# Patient Record
Sex: Female | Born: 1995 | Race: Black or African American | Hispanic: No | Marital: Single | Smoking: Never smoker
Health system: Southern US, Community
[De-identification: ages and names within clinical notes are randomized; demographics above are authoritative.]

## PROBLEM LIST (undated history)

## (undated) ENCOUNTER — Inpatient Hospital Stay (HOSPITAL_COMMUNITY): Payer: Self-pay

## (undated) HISTORY — PX: NO PAST SURGERIES: SHX2092

---

## 2016-10-19 ENCOUNTER — Encounter (HOSPITAL_COMMUNITY): Payer: Self-pay

## 2016-10-19 ENCOUNTER — Emergency Department (HOSPITAL_COMMUNITY): Payer: Medicaid Other

## 2016-10-19 ENCOUNTER — Inpatient Hospital Stay (HOSPITAL_COMMUNITY)
Admission: EM | Admit: 2016-10-19 | Discharge: 2016-10-22 | DRG: 481 | Disposition: A | Discharge status: Home / Self Care | Payer: Medicaid Other | Attending: Orthopaedic Surgery | Admitting: Orthopaedic Surgery

## 2016-10-19 DIAGNOSIS — S72322B Displaced transverse fracture of shaft of left femur, initial encounter for open fracture type I or II: Secondary | ICD-10-CM | POA: Diagnosis present

## 2016-10-19 DIAGNOSIS — O99011 Anemia complicating pregnancy, first trimester: Secondary | ICD-10-CM | POA: Diagnosis not present

## 2016-10-19 DIAGNOSIS — O9989 Other specified diseases and conditions complicating pregnancy, childbirth and the puerperium: Secondary | ICD-10-CM | POA: Diagnosis present

## 2016-10-19 DIAGNOSIS — R262 Difficulty in walking, not elsewhere classified: Secondary | ICD-10-CM

## 2016-10-19 DIAGNOSIS — Z8781 Personal history of (healed) traumatic fracture: Secondary | ICD-10-CM

## 2016-10-19 DIAGNOSIS — S72322A Displaced transverse fracture of shaft of left femur, initial encounter for closed fracture: Secondary | ICD-10-CM | POA: Diagnosis present

## 2016-10-19 DIAGNOSIS — Y92410 Unspecified street and highway as the place of occurrence of the external cause: Secondary | ICD-10-CM | POA: Diagnosis not present

## 2016-10-19 DIAGNOSIS — S7292XA Unspecified fracture of left femur, initial encounter for closed fracture: Secondary | ICD-10-CM

## 2016-10-19 DIAGNOSIS — D62 Acute posthemorrhagic anemia: Secondary | ICD-10-CM | POA: Diagnosis not present

## 2016-10-19 DIAGNOSIS — Z3A10 10 weeks gestation of pregnancy: Secondary | ICD-10-CM

## 2016-10-19 DIAGNOSIS — Z9889 Other specified postprocedural states: Secondary | ICD-10-CM

## 2016-10-19 LAB — CBC WITH DIFFERENTIAL/PLATELET
Basophils Absolute: 0 10*3/uL (ref 0.0–0.1)
Basophils Relative: 0 %
EOS ABS: 0.2 10*3/uL (ref 0.0–0.7)
Eosinophils Relative: 1 %
HCT: 35.8 % — ABNORMAL LOW (ref 36.0–46.0)
HEMOGLOBIN: 12.1 g/dL (ref 12.0–15.0)
LYMPHS ABS: 1.7 10*3/uL (ref 0.7–4.0)
LYMPHS PCT: 8 %
MCH: 28.4 pg (ref 26.0–34.0)
MCHC: 33.8 g/dL (ref 30.0–36.0)
MCV: 84 fL (ref 78.0–100.0)
MONOS PCT: 8 %
Monocytes Absolute: 1.6 10*3/uL — ABNORMAL HIGH (ref 0.1–1.0)
NEUTROS PCT: 83 %
Neutro Abs: 17 10*3/uL — ABNORMAL HIGH (ref 1.7–7.7)
Platelets: 246 10*3/uL (ref 150–400)
RBC: 4.26 MIL/uL (ref 3.87–5.11)
RDW: 14.7 % (ref 11.5–15.5)
WBC: 20.5 10*3/uL — AB (ref 4.0–10.5)

## 2016-10-19 LAB — I-STAT CHEM 8, ED
BUN: 5 mg/dL — ABNORMAL LOW (ref 6–20)
CHLORIDE: 106 mmol/L (ref 101–111)
CREATININE: 0.5 mg/dL (ref 0.44–1.00)
Calcium, Ion: 1.07 mmol/L — ABNORMAL LOW (ref 1.15–1.40)
Glucose, Bld: 139 mg/dL — ABNORMAL HIGH (ref 65–99)
HEMATOCRIT: 37 % (ref 36.0–46.0)
HEMOGLOBIN: 12.6 g/dL (ref 12.0–15.0)
POTASSIUM: 3.2 mmol/L — AB (ref 3.5–5.1)
Sodium: 139 mmol/L (ref 135–145)
TCO2: 21 mmol/L (ref 0–100)

## 2016-10-19 LAB — I-STAT BETA HCG BLOOD, ED (MC, WL, AP ONLY)

## 2016-10-19 LAB — COMPREHENSIVE METABOLIC PANEL
ALBUMIN: 3.4 g/dL — AB (ref 3.5–5.0)
ALK PHOS: 57 U/L (ref 38–126)
ALT: 17 U/L (ref 14–54)
AST: 30 U/L (ref 15–41)
Anion gap: 10 (ref 5–15)
BILIRUBIN TOTAL: 0.5 mg/dL (ref 0.3–1.2)
BUN: 5 mg/dL — AB (ref 6–20)
CALCIUM: 8.7 mg/dL — AB (ref 8.9–10.3)
CO2: 22 mmol/L (ref 22–32)
CREATININE: 0.71 mg/dL (ref 0.44–1.00)
Chloride: 107 mmol/L (ref 101–111)
GFR calc Af Amer: >60 mL/min (ref >60)
GFR calc non Af Amer: >60 mL/min (ref >60)
Glucose, Bld: 143 mg/dL — ABNORMAL HIGH (ref 65–99)
Potassium: 3.1 mmol/L — ABNORMAL LOW (ref 3.5–5.1)
Sodium: 139 mmol/L (ref 135–145)
TOTAL PROTEIN: 6.5 g/dL (ref 6.5–8.1)

## 2016-10-19 LAB — HCG, SERUM, QUALITATIVE: Preg, Serum: POSITIVE — AB

## 2016-10-19 LAB — TYPE AND SCREEN
ABO/RH(D): A POS
Antibody Screen: NEGATIVE

## 2016-10-19 LAB — I-STAT CG4 LACTIC ACID, ED: Lactic Acid, Venous: 3.18 mmol/L (ref 0.5–1.9)

## 2016-10-19 LAB — ABO/RH: ABO/RH(D): A POS

## 2016-10-19 MED ORDER — OXYCODONE HCL 5 MG PO TABS
5.0000 mg | ORAL_TABLET | ORAL | Status: DC | PRN
  Administered 2016-10-20 (×2): 10 mg via ORAL
  Filled 2016-10-19 (×2): qty 2

## 2016-10-19 MED ORDER — ACETAMINOPHEN 325 MG PO TABS: 650.0000 mg | ORAL_TABLET | Freq: Four times a day (QID) | ORAL | Status: DC | PRN

## 2016-10-19 MED ORDER — PRENATAL 27-0.8 MG PO TABS
1.0000 | ORAL_TABLET | Freq: Every day | ORAL | Status: DC
  Filled 2016-10-19: qty 1

## 2016-10-19 MED ORDER — CEFAZOLIN SODIUM-DEXTROSE 2-4 GM/100ML-% IV SOLN
2.0000 g | INTRAVENOUS | Status: AC
  Administered 2016-10-20: 2 g via INTRAVENOUS
  Filled 2016-10-19: qty 100

## 2016-10-19 MED ORDER — FENTANYL CITRATE (PF) 100 MCG/2ML IJ SOLN
INTRAMUSCULAR | Status: AC
  Filled 2016-10-19: qty 2

## 2016-10-19 MED ORDER — SODIUM CHLORIDE 0.9 % IV SOLN
INTRAVENOUS | Status: AC | PRN
  Administered 2016-10-19 (×2): 1000 mL via INTRAVENOUS

## 2016-10-19 MED ORDER — MORPHINE SULFATE (PF) 4 MG/ML IV SOLN
1.0000 mg | INTRAVENOUS | Status: DC | PRN
  Administered 2016-10-19 – 2016-10-20 (×3): 1 mg via INTRAVENOUS
  Filled 2016-10-19 (×3): qty 1

## 2016-10-19 MED ORDER — FENTANYL CITRATE (PF) 100 MCG/2ML IJ SOLN
INTRAMUSCULAR | Status: AC | PRN
  Administered 2016-10-19 (×2): 50 ug via INTRAVENOUS

## 2016-10-19 MED ORDER — POLYETHYLENE GLYCOL 3350 17 G PO PACK: 17.0000 g | PACK | Freq: Every day | ORAL | Status: DC | PRN

## 2016-10-19 MED ORDER — FENTANYL CITRATE (PF) 100 MCG/2ML IJ SOLN
50.0000 ug | Freq: Once | INTRAMUSCULAR | Status: AC
  Administered 2016-10-20 (×2): 50 ug via INTRAVENOUS
  Administered 2016-10-20 (×2): 100 ug via INTRAVENOUS

## 2016-10-19 MED ORDER — ACETAMINOPHEN 650 MG RE SUPP: 650.0000 mg | Freq: Four times a day (QID) | RECTAL | Status: DC | PRN

## 2016-10-19 MED ORDER — SENNA 8.6 MG PO TABS: 1.0000 | ORAL_TABLET | Freq: Two times a day (BID) | ORAL | Status: DC

## 2016-10-19 MED ORDER — IOPAMIDOL (ISOVUE-300) INJECTION 61%
100.0000 mL | Freq: Once | INTRAVENOUS | Status: AC | PRN
  Administered 2016-10-19: 100 mL via INTRAVENOUS

## 2016-10-19 MED ORDER — METHOCARBAMOL 500 MG PO TABS
500.0000 mg | ORAL_TABLET | Freq: Four times a day (QID) | ORAL | Status: DC | PRN
  Administered 2016-10-20 – 2016-10-22 (×4): 500 mg via ORAL
  Filled 2016-10-19 (×5): qty 1

## 2016-10-19 MED ORDER — DIAZEPAM 5 MG PO TABS
5.0000 mg | ORAL_TABLET | Freq: Four times a day (QID) | ORAL | Status: DC | PRN
  Administered 2016-10-19: 5 mg via ORAL
  Filled 2016-10-19: qty 1
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
## 2016-10-19 NOTE — Progress Notes (Signed)
Orthopedic Tech Progress Note Patient Details:  [name] 06-28-1996 [id number]  Musculoskeletal Traction Type of Traction: Gilberto BetterHare Traction Traction Location: lle Applied hare traction with assistance of dr and dan.   Trinna PostMartinez, Dorthy Magnussen J 10/19/2016, 8:31 PM
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
## 2016-10-19 NOTE — Progress Notes (Signed)
Orthopedic Tech Progress Note Patient Details:  Rhonda Blevins [birth year] [id number]  Musculoskeletal Traction Type of Traction: Gilberto BetterHare Traction Traction Location: Adjusted Hare Traction with more tenstion.  Assisted Nurse with Transfer to 5North bed [phone]. Pt did well with Transfer and additional Traction tension.   Left Leg  Alvina ChouWilliams, Talisha Erby C 10/19/2016, 10:55 PM

## 2016-10-20 ENCOUNTER — Encounter (HOSPITAL_COMMUNITY)
Admission: EM | Disposition: A | Discharge status: Home / Self Care | Payer: Self-pay | Source: Home / Self Care | Attending: Orthopaedic Surgery

## 2016-10-20 ENCOUNTER — Inpatient Hospital Stay (HOSPITAL_COMMUNITY): Payer: Medicaid Other

## 2016-10-20 ENCOUNTER — Inpatient Hospital Stay (HOSPITAL_COMMUNITY): Payer: Medicaid Other | Admitting: Anesthesiology

## 2016-10-20 ENCOUNTER — Encounter (HOSPITAL_COMMUNITY): Payer: Self-pay | Admitting: Registered Nurse

## 2016-10-20 DIAGNOSIS — S72322B Displaced transverse fracture of shaft of left femur, initial encounter for open fracture type I or II: Secondary | ICD-10-CM

## 2016-10-20 HISTORY — PX: FEMUR IM NAIL: SHX1597

## 2016-10-20 LAB — CBC
HEMATOCRIT: 34.3 % — AB (ref 36.0–46.0)
HEMOGLOBIN: 11.4 g/dL — AB (ref 12.0–15.0)
MCH: 28.1 pg (ref 26.0–34.0)
MCHC: 33.2 g/dL (ref 30.0–36.0)
MCV: 84.5 fL (ref 78.0–100.0)
Platelets: 227 10*3/uL (ref 150–400)
RBC: 4.06 MIL/uL (ref 3.87–5.11)
RDW: 14.8 % (ref 11.5–15.5)
WBC: 17.4 10*3/uL — AB (ref 4.0–10.5)

## 2016-10-20 LAB — CREATININE, SERUM: Creatinine, Ser: 0.69 mg/dL (ref 0.44–1.00)

## 2016-10-20 LAB — SURGICAL PCR SCREEN
MRSA, PCR: NEGATIVE
Staphylococcus aureus: NEGATIVE

## 2016-10-20 SURGERY — INSERTION, INTRAMEDULLARY ROD, FEMUR
Anesthesia: General | Laterality: Left

## 2016-10-20 MED ORDER — OXYCODONE HCL 5 MG PO TABS: 5.0000 mg | ORAL_TABLET | Freq: Once | ORAL | Status: DC | PRN

## 2016-10-20 MED ORDER — ONDANSETRON HCL 4 MG/2ML IJ SOLN
4.0000 mg | Freq: Four times a day (QID) | INTRAMUSCULAR | Status: DC | PRN
  Administered 2016-10-22: 4 mg via INTRAVENOUS
  Filled 2016-10-20: qty 2

## 2016-10-20 MED ORDER — ENOXAPARIN SODIUM 40 MG/0.4ML ~~LOC~~ SOLN: 40.0000 mg | Freq: Every day | SUBCUTANEOUS | 0 refills | Status: DC

## 2016-10-20 MED ORDER — PHENOL 1.4 % MT LIQD: 1.0000 | OROMUCOSAL | Status: DC | PRN

## 2016-10-20 MED ORDER — 0.9 % SODIUM CHLORIDE (POUR BTL) OPTIME
TOPICAL | Status: DC | PRN
  Administered 2016-10-20: 1000 mL

## 2016-10-20 MED ORDER — ONDANSETRON HCL 4 MG/2ML IJ SOLN
INTRAMUSCULAR | Status: AC
  Filled 2016-10-20: qty 2

## 2016-10-20 MED ORDER — LIDOCAINE 2% (20 MG/ML) 5 ML SYRINGE
INTRAMUSCULAR | Status: DC | PRN
  Administered 2016-10-20: 40 mg via INTRAVENOUS

## 2016-10-20 MED ORDER — PROPOFOL 10 MG/ML IV BOLUS
INTRAVENOUS | Status: DC | PRN
  Administered 2016-10-20: 140 mg via INTRAVENOUS

## 2016-10-20 MED ORDER — OXYCODONE HCL 5 MG PO TABS: 5.0000 mg | ORAL_TABLET | ORAL | 0 refills | Status: DC | PRN

## 2016-10-20 MED ORDER — LIDOCAINE 2% (20 MG/ML) 5 ML SYRINGE
INTRAMUSCULAR | Status: AC
  Filled 2016-10-20: qty 5

## 2016-10-20 MED ORDER — METOCLOPRAMIDE HCL 5 MG PO TABS: 5.0000 mg | ORAL_TABLET | Freq: Three times a day (TID) | ORAL | Status: DC | PRN

## 2016-10-20 MED ORDER — ACETAMINOPHEN 650 MG RE SUPP: 650.0000 mg | Freq: Four times a day (QID) | RECTAL | Status: DC | PRN

## 2016-10-20 MED ORDER — SUGAMMADEX SODIUM 200 MG/2ML IV SOLN
INTRAVENOUS | Status: AC
  Filled 2016-10-20: qty 2

## 2016-10-20 MED ORDER — LACTATED RINGERS IV SOLN
INTRAVENOUS | Status: DC
  Administered 2016-10-20 (×2): via INTRAVENOUS

## 2016-10-20 MED ORDER — ALUM & MAG HYDROXIDE-SIMETH 200-200-20 MG/5ML PO SUSP: 30.0000 mL | ORAL | Status: DC | PRN

## 2016-10-20 MED ORDER — FENTANYL CITRATE (PF) 100 MCG/2ML IJ SOLN
25.0000 ug | INTRAMUSCULAR | Status: DC | PRN
  Administered 2016-10-20 (×2): 50 ug via INTRAVENOUS

## 2016-10-20 MED ORDER — DEXAMETHASONE SODIUM PHOSPHATE 10 MG/ML IJ SOLN
INTRAMUSCULAR | Status: DC | PRN
  Administered 2016-10-20: 10 mg via INTRAVENOUS

## 2016-10-20 MED ORDER — METHOCARBAMOL 500 MG PO TABS
500.0000 mg | ORAL_TABLET | Freq: Four times a day (QID) | ORAL | Status: DC | PRN
  Administered 2016-10-22: 500 mg via ORAL
  Filled 2016-10-20: qty 1

## 2016-10-20 MED ORDER — CEFAZOLIN SODIUM-DEXTROSE 2-4 GM/100ML-% IV SOLN
2.0000 g | Freq: Four times a day (QID) | INTRAVENOUS | Status: AC
  Administered 2016-10-20 – 2016-10-21 (×3): 2 g via INTRAVENOUS
  Filled 2016-10-20 (×3): qty 100

## 2016-10-20 MED ORDER — ONDANSETRON HCL 4 MG/2ML IJ SOLN: 4.0000 mg | Freq: Once | INTRAMUSCULAR | Status: DC | PRN

## 2016-10-20 MED ORDER — DEXAMETHASONE SODIUM PHOSPHATE 10 MG/ML IJ SOLN
INTRAMUSCULAR | Status: AC
  Filled 2016-10-20: qty 1

## 2016-10-20 MED ORDER — MORPHINE SULFATE (PF) 2 MG/ML IV SOLN: 0.5000 mg | INTRAVENOUS | Status: DC | PRN

## 2016-10-20 MED ORDER — ENOXAPARIN SODIUM 40 MG/0.4ML ~~LOC~~ SOLN
40.0000 mg | SUBCUTANEOUS | Status: DC
  Administered 2016-10-21 – 2016-10-22 (×2): 40 mg via SUBCUTANEOUS
  Filled 2016-10-20 (×2): qty 0.4

## 2016-10-20 MED ORDER — FENTANYL CITRATE (PF) 100 MCG/2ML IJ SOLN
INTRAMUSCULAR | Status: AC
  Filled 2016-10-20: qty 4

## 2016-10-20 MED ORDER — FENTANYL CITRATE (PF) 100 MCG/2ML IJ SOLN
INTRAMUSCULAR | Status: AC
  Administered 2016-10-20: 50 ug via INTRAVENOUS
  Filled 2016-10-20: qty 2

## 2016-10-20 MED ORDER — ONDANSETRON HCL 4 MG PO TABS: 4.0000 mg | ORAL_TABLET | Freq: Four times a day (QID) | ORAL | Status: DC | PRN

## 2016-10-20 MED ORDER — METHOCARBAMOL 750 MG PO TABS: 750.0000 mg | ORAL_TABLET | Freq: Two times a day (BID) | ORAL | 0 refills | Status: DC | PRN

## 2016-10-20 MED ORDER — ACETAMINOPHEN 325 MG PO TABS: 650.0000 mg | ORAL_TABLET | Freq: Four times a day (QID) | ORAL | Status: DC | PRN

## 2016-10-20 MED ORDER — SUCCINYLCHOLINE CHLORIDE 200 MG/10ML IV SOSY
PREFILLED_SYRINGE | INTRAVENOUS | Status: DC | PRN
  Administered 2016-10-20: 120 mg via INTRAVENOUS

## 2016-10-20 MED ORDER — SUCCINYLCHOLINE CHLORIDE 200 MG/10ML IV SOSY
PREFILLED_SYRINGE | INTRAVENOUS | Status: AC
  Filled 2016-10-20: qty 10

## 2016-10-20 MED ORDER — OXYCODONE HCL 5 MG/5ML PO SOLN: 5.0000 mg | Freq: Once | ORAL | Status: DC | PRN

## 2016-10-20 MED ORDER — MIDAZOLAM HCL 2 MG/2ML IJ SOLN
INTRAMUSCULAR | Status: AC
  Filled 2016-10-20: qty 2

## 2016-10-20 MED ORDER — OXYCODONE HCL 5 MG PO TABS
5.0000 mg | ORAL_TABLET | ORAL | Status: DC | PRN
  Administered 2016-10-20 – 2016-10-21 (×2): 10 mg via ORAL
  Filled 2016-10-20 (×2): qty 2

## 2016-10-20 MED ORDER — PROPOFOL 10 MG/ML IV BOLUS
INTRAVENOUS | Status: AC
  Filled 2016-10-20: qty 20

## 2016-10-20 MED ORDER — ONDANSETRON HCL 4 MG PO TABS: 4.0000 mg | ORAL_TABLET | Freq: Three times a day (TID) | ORAL | 0 refills | Status: DC | PRN

## 2016-10-20 MED ORDER — ROCURONIUM BROMIDE 10 MG/ML (PF) SYRINGE
PREFILLED_SYRINGE | INTRAVENOUS | Status: AC
  Filled 2016-10-20: qty 10

## 2016-10-20 MED ORDER — DEXTROSE 5 % IV SOLN
500.0000 mg | Freq: Four times a day (QID) | INTRAVENOUS | Status: DC | PRN
  Filled 2016-10-20: qty 5

## 2016-10-20 MED ORDER — HYDROCODONE-ACETAMINOPHEN 5-325 MG PO TABS
1.0000 | ORAL_TABLET | Freq: Four times a day (QID) | ORAL | Status: DC | PRN
  Administered 2016-10-21 – 2016-10-22 (×6): 2 via ORAL
  Filled 2016-10-20 (×6): qty 2

## 2016-10-20 MED ORDER — MENTHOL 3 MG MT LOZG
1.0000 | LOZENGE | OROMUCOSAL | Status: DC | PRN
Start: 2016-10-20 — End: 2016-10-22

## 2016-10-20 MED ORDER — SENNOSIDES-DOCUSATE SODIUM 8.6-50 MG PO TABS: 1.0000 | ORAL_TABLET | Freq: Every evening | ORAL | 1 refills | Status: DC | PRN

## 2016-10-20 MED ORDER — SUGAMMADEX SODIUM 200 MG/2ML IV SOLN
INTRAVENOUS | Status: DC | PRN
  Administered 2016-10-20: 200 mg via INTRAVENOUS

## 2016-10-20 MED ORDER — ONDANSETRON HCL 4 MG/2ML IJ SOLN
INTRAMUSCULAR | Status: DC | PRN
  Administered 2016-10-20: 4 mg via INTRAVENOUS

## 2016-10-20 MED ORDER — ROCURONIUM BROMIDE 10 MG/ML (PF) SYRINGE
PREFILLED_SYRINGE | INTRAVENOUS | Status: DC | PRN
  Administered 2016-10-20: 50 mg via INTRAVENOUS

## 2016-10-20 MED ORDER — METOCLOPRAMIDE HCL 5 MG/ML IJ SOLN: 5.0000 mg | Freq: Three times a day (TID) | INTRAMUSCULAR | Status: DC | PRN

## 2016-10-20 MED ORDER — SODIUM CHLORIDE 0.9 % IV SOLN
INTRAVENOUS | Status: DC
  Administered 2016-10-20: 16:00:00 via INTRAVENOUS

## 2016-10-20 MED ORDER — FENTANYL CITRATE (PF) 100 MCG/2ML IJ SOLN
INTRAMUSCULAR | Status: AC
  Filled 2016-10-20: qty 2

## 2016-10-20 SURGICAL SUPPLY — 51 items
BIT DRILL LONG 4.0 (BIT) ×1 IMPLANT
BIT DRILL SHORT 4.0 (BIT) ×1 IMPLANT
BNDG COHESIVE 4X5 TAN STRL (GAUZE/BANDAGES/DRESSINGS) ×3 IMPLANT
COVER PERINEAL POST (MISCELLANEOUS) ×3 IMPLANT
COVER SURGICAL LIGHT HANDLE (MISCELLANEOUS) ×3 IMPLANT
DRAPE C-ARMOR (DRAPES) ×3 IMPLANT
DRAPE INCISE IOBAN 66X45 STRL (DRAPES) ×3 IMPLANT
DRAPE ORTHO SPLIT 77X108 STRL (DRAPES)
DRAPE PROXIMA HALF (DRAPES) ×9 IMPLANT
DRAPE SURG ORHT 6 SPLT 77X108 (DRAPES) IMPLANT
DRILL BIT LONG 4.0 (BIT) ×3
DRILL BIT SHORT 4.0 (BIT) ×2
DRSG MEPILEX BORDER 4X4 (GAUZE/BANDAGES/DRESSINGS) ×9 IMPLANT
DURAPREP 26ML APPLICATOR (WOUND CARE) ×3 IMPLANT
ELECT CAUTERY BLADE 6.4 (BLADE) ×3 IMPLANT
ELECT REM PT RETURN 9FT ADLT (ELECTROSURGICAL) ×3
ELECTRODE REM PT RTRN 9FT ADLT (ELECTROSURGICAL) ×1 IMPLANT
FACESHIELD WRAPAROUND (MASK) IMPLANT
GLOVE BIO SURGEON STRL SZ 6.5 (GLOVE) ×2 IMPLANT
GLOVE BIO SURGEONS STRL SZ 6.5 (GLOVE) ×1
GLOVE BIOGEL PI IND STRL 6.5 (GLOVE) ×2 IMPLANT
GLOVE BIOGEL PI IND STRL 7.0 (GLOVE) ×1 IMPLANT
GLOVE BIOGEL PI INDICATOR 6.5 (GLOVE) ×4
GLOVE BIOGEL PI INDICATOR 7.0 (GLOVE) ×2
GLOVE SKINSENSE NS SZ7.5 (GLOVE) ×4
GLOVE SKINSENSE STRL SZ7.5 (GLOVE) ×2 IMPLANT
GLOVE SURG SYN 7.5  E (GLOVE) ×2
GLOVE SURG SYN 7.5 E (GLOVE) ×1 IMPLANT
GOWN BRE IMP SLV AUR LG STRL (GOWN DISPOSABLE) ×6 IMPLANT
GOWN STRL REIN XL XLG (GOWN DISPOSABLE) ×3 IMPLANT
GUIDE PIN 3.2X343 (PIN) ×1
GUIDE PIN 3.2X343MM (PIN) ×2
GUIDE ROD 3.0 (MISCELLANEOUS) ×3
KIT BASIN OR (CUSTOM PROCEDURE TRAY) ×3 IMPLANT
KIT ROOM TURNOVER OR (KITS) ×3 IMPLANT
LINER BOOT UNIVERSAL DISP (MISCELLANEOUS) ×3 IMPLANT
MANIFOLD NEPTUNE II (INSTRUMENTS) IMPLANT
NAIL TRIGEN META LEFT 9.0X38MM (Nail) ×3 IMPLANT
NS IRRIG 1000ML POUR BTL (IV SOLUTION) ×3 IMPLANT
PACK GENERAL/GYN (CUSTOM PROCEDURE TRAY) ×3 IMPLANT
PAD ARMBOARD 7.5X6 YLW CONV (MISCELLANEOUS) ×9 IMPLANT
PIN GUIDE 3.2X343MM (PIN) ×1 IMPLANT
ROD GUIDE 3.0 (MISCELLANEOUS) ×1 IMPLANT
SCREW TRIGEN LOW PROF 5.0X37.5 (Screw) ×3 IMPLANT
SCREW TRIGEN LOW PROF 5.0X55 (Screw) ×3 IMPLANT
STAPLER VISISTAT 35W (STAPLE) ×3 IMPLANT
SUT VIC AB 0 CT1 27 (SUTURE) ×2
SUT VIC AB 0 CT1 27XBRD ANBCTR (SUTURE) ×1 IMPLANT
SUT VIC AB 2-0 CT1 27 (SUTURE) ×2
SUT VIC AB 2-0 CT1 TAPERPNT 27 (SUTURE) ×1 IMPLANT
WATER STERILE IRR 1000ML POUR (IV SOLUTION) ×6 IMPLANT

## 2016-10-20 NOTE — Transfer of Care (Signed)
Immediate Anesthesia Transfer of Care Note  Patient: Rhonda Blevins  Procedure(s) Performed: Procedure(s): INTRAMEDULLARY (IM) NAIL FEMORAL (Left)  Patient Location: PACU  Anesthesia Type:General  Level of Consciousness:  sedated, patient cooperative and responds to stimulation  Airway & Oxygen Therapy:Patient Spontanous Breathing and Patient connected to face mask oxgen  Post-op Assessment:  Report given to PACU RN and Post -op Vital signs reviewed and stable  Post vital signs:  Reviewed and stable  Last Vitals:  Vitals:   10/19/16 2304 10/20/16 0430  BP: (!) 143/89 (!) 145/85  Pulse: (!) 108 (!) 108  Resp: 20 20  Temp: 37.2 C 36.8 C    Complications: No apparent anesthesia complications

## 2016-10-20 NOTE — Anesthesia Postprocedure Evaluation (Signed)
Anesthesia Post Note  Patient: Rhonda Blevins  Procedure(s) Performed: Procedure(s) (LRB): INTRAMEDULLARY (IM) NAIL FEMORAL (Left)  Patient location during evaluation: PACU Anesthesia Type: General Level of consciousness: awake, awake and alert and oriented Pain management: pain level controlled Vital Signs Assessment: post-procedure vital signs reviewed and stable Respiratory status: spontaneous breathing, nonlabored ventilation and respiratory function stable Cardiovascular status: blood pressure returned to baseline    Last Vitals:  Vitals:   10/20/16 1242 10/20/16 1315  BP: (!) 147/79 130/84  Pulse: (!) 122 (!) 108  Resp: (!) 24   Temp: 36.6 C 37.1 C    Last Pain:  Vitals:   10/20/16 1315  TempSrc: Oral  PainSc:                  [name]
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
## 2016-10-20 NOTE — Anesthesia Procedure Notes (Signed)
Procedure Name: Intubation Date/Time: 10/20/2016 10:07 AM Performed by: Jhonnie GarnerMARSHALL, Angline Schweigert M Pre-anesthesia Checklist: Patient identified, Emergency Drugs available, Suction available and Patient being monitored Patient Re-evaluated:Patient Re-evaluated prior to inductionOxygen Delivery Method: Circle system utilized Preoxygenation: Pre-oxygenation with 100% oxygen Intubation Type: IV induction, Rapid sequence and Cricoid Pressure applied Laryngoscope Size: Miller and 2 Grade View: Grade I Tube type: Oral Tube size: 7.0 mm Number of attempts: 1 Airway Equipment and Method: Stylet Placement Confirmation: ETT inserted through vocal cords under direct vision,  positive ETCO2 and breath sounds checked- equal and bilateral Secured at: 21 cm Tube secured with: Tape Dental Injury: Teeth and Oropharynx as per pre-operative assessment

## 2016-10-20 NOTE — Progress Notes (Signed)
Report called to Gaynelle Aduobbie, RN [facility].

## 2016-10-20 NOTE — Progress Notes (Signed)
Pt surgical dressing on Lt lateral leg soaked with blood. No active bleeding noted. Put 4x4 gauze and abdominal pads and tape it. Nursing will continue to monitor.
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
## 2016-10-20 NOTE — Discharge Instructions (Signed)
° ° °  1. Change dressings as needed °2. May shower but keep incisions covered and dry °3. Take lovenox to prevent blood clots °4. Take stool softeners as needed °5. Take pain meds as needed ° °

## 2016-10-21 LAB — CBC
HCT: 29.6 % — ABNORMAL LOW (ref 36.0–46.0)
Hemoglobin: 9.8 g/dL — ABNORMAL LOW (ref 12.0–15.0)
MCH: 27.9 pg (ref 26.0–34.0)
MCHC: 33.1 g/dL (ref 30.0–36.0)
MCV: 84.3 fL (ref 78.0–100.0)
PLATELETS: 212 10*3/uL (ref 150–400)
RBC: 3.51 MIL/uL — ABNORMAL LOW (ref 3.87–5.11)
RDW: 14.9 % (ref 11.5–15.5)
WBC: 16.8 10*3/uL — AB (ref 4.0–10.5)

## 2016-10-21 LAB — BASIC METABOLIC PANEL
ANION GAP: 6 (ref 5–15)
BUN: <5 mg/dL — ABNORMAL LOW (ref 6–20)
CALCIUM: 8.1 mg/dL — AB (ref 8.9–10.3)
CO2: 25 mmol/L (ref 22–32)
Chloride: 104 mmol/L (ref 101–111)
Creatinine, Ser: 0.64 mg/dL (ref 0.44–1.00)
GFR calc non Af Amer: >60 mL/min (ref >60)
Glucose, Bld: 95 mg/dL (ref 65–99)
Potassium: 3.5 mmol/L (ref 3.5–5.1)
SODIUM: 135 mmol/L (ref 135–145)
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
## 2016-10-21 NOTE — Progress Notes (Signed)
   Subjective:  Patient reports pain as mild.    Objective:   VITALS:   Vitals:   10/20/16 1315 10/20/16 2136 10/21/16 0111 10/21/16 0618  BP: 130/84 133/78 137/77 124/77  Pulse: (!) 108 85 85 94  Resp:  18 16 16   Temp: 98.7 F (37.1 C) 99.7 F (37.6 C) 99.5 F (37.5 C) 98.7 F (37.1 C)  TempSrc: Oral Oral Oral Oral  SpO2: 100% 100% 100% 100%  Weight:      Height:        Neurologically intact Neurovascular intact Sensation intact distally Intact pulses distally Dorsiflexion/Plantar flexion intact Incision: dressing C/D/I and no drainage No cellulitis present Compartment soft   Lab Results  Component Value Date   WBC 16.8 (H) 10/21/2016   HGB 9.8 (L) 10/21/2016   HCT 29.6 (L) 10/21/2016   MCV 84.3 10/21/2016   PLT 212 10/21/2016     Assessment/Plan:  1 Day Post-Op   - Expected postop acute blood loss anemia - will monitor for symptoms - Up with PT/OT - DVT ppx - SCDs, ambulation, lovenox - WBAT operative extremity - Pain control - Discharge planning - may go home today if she does well with PT, otherwise Dannielle Huhmonday  Michael Sharnell Knight 10/21/2016, 8:59 AM [phone]
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
## 2016-10-22 LAB — BASIC METABOLIC PANEL
ANION GAP: 9 (ref 5–15)
CO2: 24 mmol/L (ref 22–32)
Calcium: 8.5 mg/dL — ABNORMAL LOW (ref 8.9–10.3)
Chloride: 102 mmol/L (ref 101–111)
Creatinine, Ser: 0.58 mg/dL (ref 0.44–1.00)
Glucose, Bld: 84 mg/dL (ref 65–99)
POTASSIUM: 3.8 mmol/L (ref 3.5–5.1)
SODIUM: 135 mmol/L (ref 135–145)

## 2016-10-22 LAB — CBC
HCT: 29.5 % — ABNORMAL LOW (ref 36.0–46.0)
Hemoglobin: 9.7 g/dL — ABNORMAL LOW (ref 12.0–15.0)
MCH: 27.9 pg (ref 26.0–34.0)
MCHC: 32.9 g/dL (ref 30.0–36.0)
MCV: 84.8 fL (ref 78.0–100.0)
PLATELETS: 215 10*3/uL (ref 150–400)
RBC: 3.48 MIL/uL — AB (ref 3.87–5.11)
RDW: 15.1 % (ref 11.5–15.5)
WBC: 12.9 10*3/uL — AB (ref 4.0–10.5)
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
## 2016-10-22 NOTE — Progress Notes (Signed)
Pt ready for discharge. Education/instructions reviewed with pt and all questions/concerns addressed. IVs removed and belongings gathered. Pt will be transported out via wheelchair to boyfriend's vehicle. Will continue to monitor

## 2016-10-22 NOTE — Progress Notes (Signed)
   Subjective:  Patient reports pain as mild.    Objective:   VITALS:   Vitals:   10/21/16 0618 10/21/16 1449 10/21/16 2103 10/22/16 0549  BP: 124/77 129/70 127/80 133/84  Pulse: 94 (!) 109 (!) 114 83  Resp: 16  16 16   Temp: 98.7 F (37.1 C) 98.2 F (36.8 C) 98.6 F (37 C) 99 F (37.2 C)  TempSrc: Oral Oral Oral Oral  SpO2: 100% 100% 100% 100%  Weight:      Height:        Neurologically intact Neurovascular intact Sensation intact distally Intact pulses distally Dorsiflexion/Plantar flexion intact Incision: dressing C/D/I and no drainage No cellulitis present Compartment soft   Lab Results  Component Value Date   WBC 12.9 (H) 10/22/2016   HGB 9.7 (L) 10/22/2016   HCT 29.5 (L) 10/22/2016   MCV 84.8 10/22/2016   PLT 215 10/22/2016     Assessment/Plan:  2 Days Post-Op   - stable for dc today home  Glee ArvinMichael Xu 10/22/2016, 7:28 AM [phone]

## 2016-10-22 NOTE — Progress Notes (Signed)
Orthopedic Tech Progress Note Patient Details:  Jarrett SohoRaven Bagshaw [birth year] [id number]  Ortho Devices Type of Ortho Device: Crutches Ortho Device/Splint Interventions: Application   Saul FordyceJennifer C Blaize Nipper 10/22/2016, 10:39 AM
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
## 2016-10-23 ENCOUNTER — Encounter (HOSPITAL_COMMUNITY): Payer: Self-pay | Admitting: Orthopaedic Surgery

## 2016-10-23 ENCOUNTER — Telehealth (INDEPENDENT_AMBULATORY_CARE_PROVIDER_SITE_OTHER): Payer: Self-pay | Admitting: Orthopaedic Surgery

## 2016-10-23 NOTE — Telephone Encounter (Signed)
Patient has post op care questions pertaining to surgery.

## 2016-10-24 NOTE — Telephone Encounter (Signed)
Called pt. She wanted to make a post op appt. Also had some questions regarding Su. Wanted to know if she can get incision wet. I advised keep bandage on until she comes in to see us on 11/02/16. We will talk more in detail l on what to do next on her appt date

## 2016-10-26 NOTE — Telephone Encounter (Signed)
Pt called back stating she was itching and wanted to take bandages off.

## 2016-10-26 NOTE — Telephone Encounter (Signed)
That's fine

## 2016-10-26 NOTE — Telephone Encounter (Signed)
Please advise 

## 2016-10-29 NOTE — Telephone Encounter (Signed)
Called pt no answer LMOM.

## 2016-11-02 ENCOUNTER — Ambulatory Visit (INDEPENDENT_AMBULATORY_CARE_PROVIDER_SITE_OTHER): Payer: Medicaid Other | Admitting: Orthopaedic Surgery

## 2016-11-02 ENCOUNTER — Ambulatory Visit (INDEPENDENT_AMBULATORY_CARE_PROVIDER_SITE_OTHER): Payer: Self-pay

## 2016-11-02 ENCOUNTER — Ambulatory Visit (INDEPENDENT_AMBULATORY_CARE_PROVIDER_SITE_OTHER): Payer: Medicaid Other

## 2016-11-02 DIAGNOSIS — S72322B Displaced transverse fracture of shaft of left femur, initial encounter for open fracture type I or II: Secondary | ICD-10-CM

## 2016-11-03 NOTE — Progress Notes (Signed)
The patient is 2 weeks status post intramedullary fixation of a midshaft femur fracture. She is ambulating with crutches today. She has moderate pain. Her baby is doing okay. Her be OB/GYN is aware of the recent events. The incisions are well healed. She has minimal swelling. No signs of infection. X-ray show stable alignment and fixation of the fracture. The staples were removed. Continue to ambulate weightbearing as tolerated with crutches and increase activity as tolerated. I'll like to see her back in 2 months with repeat 2 view x-rays of the left femur.

## 2017-01-01 ENCOUNTER — Encounter (INDEPENDENT_AMBULATORY_CARE_PROVIDER_SITE_OTHER): Payer: Self-pay

## 2017-01-01 ENCOUNTER — Ambulatory Visit (INDEPENDENT_AMBULATORY_CARE_PROVIDER_SITE_OTHER): Payer: Medicaid Other

## 2017-01-01 ENCOUNTER — Ambulatory Visit (INDEPENDENT_AMBULATORY_CARE_PROVIDER_SITE_OTHER): Payer: Medicaid Other | Admitting: Orthopaedic Surgery

## 2017-01-01 ENCOUNTER — Encounter (INDEPENDENT_AMBULATORY_CARE_PROVIDER_SITE_OTHER): Payer: Self-pay | Admitting: Orthopaedic Surgery

## 2017-01-01 DIAGNOSIS — S72322B Displaced transverse fracture of shaft of left femur, initial encounter for open fracture type I or II: Secondary | ICD-10-CM | POA: Diagnosis not present

## 2017-01-01 NOTE — Progress Notes (Signed)
Rhonda Blevins is 2 months s/p left femur IMN.  Doing better.  Walking with 1 crutch.  Pregnancy has been going well.  C/o muscular soreness and fatigability.  xrays show healing fracture.  F/u 2 months with repeat left femur xrays

## 2017-01-04 ENCOUNTER — Ambulatory Visit (INDEPENDENT_AMBULATORY_CARE_PROVIDER_SITE_OTHER): Payer: Medicaid Other | Admitting: Orthopaedic Surgery

## 2017-03-04 ENCOUNTER — Encounter (INDEPENDENT_AMBULATORY_CARE_PROVIDER_SITE_OTHER): Payer: Self-pay | Admitting: Orthopaedic Surgery

## 2017-03-04 ENCOUNTER — Ambulatory Visit (INDEPENDENT_AMBULATORY_CARE_PROVIDER_SITE_OTHER): Payer: Medicaid Other | Admitting: Orthopaedic Surgery

## 2017-03-04 ENCOUNTER — Encounter (INDEPENDENT_AMBULATORY_CARE_PROVIDER_SITE_OTHER): Payer: Self-pay

## 2017-03-04 ENCOUNTER — Ambulatory Visit (INDEPENDENT_AMBULATORY_CARE_PROVIDER_SITE_OTHER): Payer: Medicaid Other

## 2017-03-04 DIAGNOSIS — S72322B Displaced transverse fracture of shaft of left femur, initial encounter for open fracture type I or II: Secondary | ICD-10-CM | POA: Diagnosis not present

## 2017-03-04 NOTE — Progress Notes (Signed)
   Office Visit Note   Patient: Rhonda Blevins           Date of Birth: [birth year]           MRN: [id number] Visit Date: 03/04/2017              Requested by: No referring provider defined for this encounter. PCP: Pcp Not In System   Assessment & Plan: Visit Diagnoses:  1. Displaced transverse fracture of shaft of left femur, initial encounter for open fracture type I or II (HCC)     Plan: X-ray show healed midshaft fracture with abundant callus formation. This point patient has reached MMI. Follow up with me as needed.  Follow-Up Instructions: Return if symptoms worsen or fail to improve.   Orders:  Orders Placed This Encounter  Procedures  . XR FEMUR MIN 2 VIEWS LEFT   No orders of the defined types were placed in this encounter.     Procedures: No procedures performed   Clinical Data: No additional findings.   Subjective: Chief Complaint  Patient presents with  . Left Leg - Pain    Patient is 4 months status post intramedullary fixation of left midshaft femur fracture. She is doing well. She is ambulating without pain. She really does not have any real complaints    Review of Systems   Objective: Vital Signs: There were no vitals taken for this visit.  Physical Exam  Ortho Exam Left lower extremity exam is benign. Specialty Comments:  No specialty comments available.  Imaging: Xr Femur Min 2 Views Left  Result Date: 03/04/2017 Abundant callus formation of the femur fracture was stable station.    PMFS History: Patient Active Problem List   Diagnosis Date Noted  . Displaced transverse fracture of shaft of left femur, initial encounter for open fracture type I or II (HCC) 10/19/2016   No past medical history on file.  No family history on file.  Past Surgical History:  Procedure Laterality Date  . FEMUR IM NAIL Left 10/20/2016   Procedure: INTRAMEDULLARY (IM) NAIL FEMORAL;  Surgeon: Tarry Kos, MD;  Location: [facility] OR;  Service: Orthopedics;   Laterality: Left;

## 2017-04-26 ENCOUNTER — Encounter (HOSPITAL_COMMUNITY): Payer: Self-pay | Admitting: *Deleted

## 2017-04-26 ENCOUNTER — Inpatient Hospital Stay (HOSPITAL_COMMUNITY)
Admission: AD | Admit: 2017-04-26 | Discharge: 2017-04-26 | Disposition: A | Discharge status: Home / Self Care | Payer: Medicaid Other | Source: Ambulatory Visit | Attending: Obstetrics and Gynecology | Admitting: Obstetrics and Gynecology

## 2017-04-26 DIAGNOSIS — R102 Pelvic and perineal pain: Secondary | ICD-10-CM | POA: Insufficient documentation

## 2017-04-26 DIAGNOSIS — O26893 Other specified pregnancy related conditions, third trimester: Secondary | ICD-10-CM | POA: Insufficient documentation

## 2017-04-26 DIAGNOSIS — Z79899 Other long term (current) drug therapy: Secondary | ICD-10-CM | POA: Diagnosis not present

## 2017-04-26 DIAGNOSIS — O9989 Other specified diseases and conditions complicating pregnancy, childbirth and the puerperium: Secondary | ICD-10-CM | POA: Diagnosis not present

## 2017-04-26 DIAGNOSIS — Z3A37 37 weeks gestation of pregnancy: Secondary | ICD-10-CM | POA: Insufficient documentation

## 2017-04-26 DIAGNOSIS — Z3493 Encounter for supervision of normal pregnancy, unspecified, third trimester: Secondary | ICD-10-CM

## 2017-04-26 LAB — URINALYSIS, ROUTINE W REFLEX MICROSCOPIC
BILIRUBIN URINE: NEGATIVE
Glucose, UA: 50 mg/dL — AB
Hgb urine dipstick: NEGATIVE
KETONES UR: NEGATIVE mg/dL
Leukocytes, UA: NEGATIVE
NITRITE: NEGATIVE
Protein, ur: NEGATIVE mg/dL
Specific Gravity, Urine: 1.024 (ref 1.005–1.030)
pH: 6 (ref 5.0–8.0)

## 2017-04-26 LAB — WET PREP, GENITAL
Clue Cells Wet Prep HPF POC: NONE SEEN
Sperm: NONE SEEN
TRICH WET PREP: NONE SEEN
YEAST WET PREP: NONE SEEN

## 2017-04-26 NOTE — Progress Notes (Addendum)
G2P1 @ 37.[redacted] wksga. Presents to triage for vaginal pain that started 2 days ago. Last intercourse was last week Sunday. Denies bleeding or LOF. +FM. EFM applied.   1430: Provider made aware pt in room. Monitors adjusted. Pt on left side.   1334: provider at bs assessing pt.   1509: lab result pending still.   1541: Discharge instructions given with pt understanding. Pt left unit via ambulatory.

## 2017-04-26 NOTE — Discharge Instructions (Signed)

## 2017-04-26 NOTE — MAU Provider Note (Signed)
History     CSN: [id number]  Arrival date and time: 04/26/17 1213   None     Chief Complaint  Patient presents with  . Vaginal Pain  . pelvic pressure   HPI Rhonda Blevins is 21 y.o. G2P1 231w2d weeks presenting with vaginal pain that began         Describes as deep, sharp pain that worsens after she urinates.    No past medical history on file.  Past Surgical History:  Procedure Laterality Date  . FEMUR IM NAIL Left 10/20/2016   Procedure: INTRAMEDULLARY (IM) NAIL FEMORAL;  Surgeon: Tarry KosNaiping M Xu, MD;  Location: [facility] OR;  Service: Orthopedics;  Laterality: Left;    No family history on file.  Social History  Substance Use Topics  . Smoking status: Never Smoker  . Smokeless tobacco: Never Used  . Alcohol use Not on file    Allergies: No Known Allergies  Prescriptions Prior to Admission  Medication Sig Dispense Refill Last Dose  . enoxaparin (LOVENOX) 40 MG/0.4ML injection Inject 0.4 mLs (40 mg total) into the skin daily. 14 Syringe 0   . methocarbamol (ROBAXIN) 750 MG tablet Take 1 tablet (750 mg total) by mouth 2 (two) times daily as needed for muscle spasms. 60 tablet 0   . ondansetron (ZOFRAN) 4 MG tablet Take 1-2 tablets (4-8 mg total) by mouth every 8 (eight) hours as needed for nausea or vomiting. 40 tablet 0   . oxyCODONE (OXY IR/ROXICODONE) 5 MG immediate release tablet Take 1-3 tablets (5-15 mg total) by mouth every 4 (four) hours as needed. 90 tablet 0   . Prenatal Vit-Fe Fumarate-FA (MULTIVITAMIN-PRENATAL) 27-0.8 MG TABS tablet Take 1 tablet by mouth daily at 12 noon.   10/18/2016 at Unknown time  . senna-docusate (SENOKOT S) 8.6-50 MG tablet Take 1 tablet by mouth at bedtime as needed. 30 tablet 1     Review of Systems  Constitutional: Negative for activity change.  Respiratory: Negative for chest tightness.   Cardiovascular: Negative for chest pain.  Gastrointestinal: Negative for abdominal pain.  Genitourinary: Positive for dysuria, vaginal discharge and  vaginal pain. Negative for frequency.   Physical Exam   Blood pressure 125/73, pulse 100, temperature 98.2 F (36.8 C), temperature source Oral, resp. rate 16, height 5\' 5"  (1.651 m), weight 172 lb 8 oz (78.2 kg), SpO2 100 %.  Physical Exam  Constitutional: She is oriented to person, place, and time. She appears well-developed and well-nourished. No distress.  HENT:  Head: Normocephalic.  Neck: Normal range of motion.  Cardiovascular: Normal rate.   Respiratory: Effort normal.  GI: Soft. She exhibits no distension and no mass. There is no tenderness. There is no rebound and no guarding.  Genitourinary: There is no rash, tenderness or lesion on the right labia. There is no rash, tenderness or lesion on the left labia. There is tenderness (neg for lesions, ulcerations or excoriations) and bleeding in the vagina. No erythema in the vagina. Vaginal discharge (moderate amount of white discharge without odor) found.  Genitourinary Comments: Cervical exam by [name],RN  1 cm dilated, 30-40 effaced and ballotable.  Neg for bleeding  Neurological: She is alert and oriented to person, place, and time.  Skin: Skin is warm and dry.  Psychiatric: She has a normal mood and affect. Her behavior is normal. Thought content normal.    Results for orders placed or performed during the hospital encounter of 04/26/17 (from the past 24 hour(s))  Urinalysis, Routine w reflex microscopic  Status: Abnormal   Collection Time: 04/26/17 12:30 PM  Result Value Ref Range   Color, Urine YELLOW YELLOW   APPearance HAZY (A) CLEAR   Specific Gravity, Urine 1.024 1.005 - 1.030   pH 6.0 5.0 - 8.0   Glucose, UA 50 (A) NEGATIVE mg/dL   Hgb urine dipstick NEGATIVE NEGATIVE   Bilirubin Urine NEGATIVE NEGATIVE   Ketones, ur NEGATIVE NEGATIVE mg/dL   Protein, ur NEGATIVE NEGATIVE mg/dL   Nitrite NEGATIVE NEGATIVE   Leukocytes, UA NEGATIVE NEGATIVE  Wet prep, genital     Status: Abnormal   Collection Time: 04/26/17   2:40 PM  Result Value Ref Range   Yeast Wet Prep HPF POC NONE SEEN NONE SEEN   Trich, Wet Prep NONE SEEN NONE SEEN   Clue Cells Wet Prep HPF POC NONE SEEN NONE SEEN   WBC, Wet Prep HPF POC MANY (A) NONE SEEN   Sperm NONE SEEN     NST  Baseline  145           Variability Mod           15X15 accels           Reactive        Occasional contraction MAU Course  Procedures  MDM MSE Exam Labs NST-reactive  Assessment and Plan  A: vaginal pain without findings     [redacted]w[redacted]d gestation  P:  Keep scheduled appt for continued prenatal care       Return for decreased fetal movement, vaginal bleeding, LOF, and increase in pain   Eve M Nazir Hacker 04/26/2017, 3:25 PM

## 2017-04-26 NOTE — MAU Note (Signed)
Just been having pains in her vagina.  Deep sharp pain.  Lot of pressure, worse after she pees.

## 2017-05-01 ENCOUNTER — Inpatient Hospital Stay (HOSPITAL_COMMUNITY)
Admission: AD | Admit: 2017-05-01 | Discharge: 2017-05-01 | Disposition: A | Discharge status: Home / Self Care | Payer: Medicaid Other | Source: Ambulatory Visit | Attending: Obstetrics and Gynecology | Admitting: Obstetrics and Gynecology

## 2017-05-01 ENCOUNTER — Inpatient Hospital Stay (HOSPITAL_COMMUNITY): Payer: Medicaid Other

## 2017-05-01 DIAGNOSIS — R102 Pelvic and perineal pain: Secondary | ICD-10-CM | POA: Insufficient documentation

## 2017-05-01 DIAGNOSIS — O133 Gestational [pregnancy-induced] hypertension without significant proteinuria, third trimester: Secondary | ICD-10-CM | POA: Diagnosis not present

## 2017-05-01 DIAGNOSIS — O26893 Other specified pregnancy related conditions, third trimester: Secondary | ICD-10-CM | POA: Diagnosis not present

## 2017-05-01 DIAGNOSIS — O289 Unspecified abnormal findings on antenatal screening of mother: Secondary | ICD-10-CM | POA: Insufficient documentation

## 2017-05-01 DIAGNOSIS — Z3A38 38 weeks gestation of pregnancy: Secondary | ICD-10-CM

## 2017-05-01 DIAGNOSIS — R030 Elevated blood-pressure reading, without diagnosis of hypertension: Secondary | ICD-10-CM | POA: Diagnosis not present

## 2017-05-01 DIAGNOSIS — N898 Other specified noninflammatory disorders of vagina: Secondary | ICD-10-CM | POA: Diagnosis not present

## 2017-05-01 DIAGNOSIS — R109 Unspecified abdominal pain: Secondary | ICD-10-CM

## 2017-05-01 DIAGNOSIS — O288 Other abnormal findings on antenatal screening of mother: Secondary | ICD-10-CM

## 2017-05-01 DIAGNOSIS — O163 Unspecified maternal hypertension, third trimester: Secondary | ICD-10-CM

## 2017-05-01 LAB — COMPREHENSIVE METABOLIC PANEL
ALK PHOS: 131 U/L — AB (ref 38–126)
ALT: 13 U/L — ABNORMAL LOW (ref 14–54)
ANION GAP: 8 (ref 5–15)
AST: 19 U/L (ref 15–41)
Albumin: 3.2 g/dL — ABNORMAL LOW (ref 3.5–5.0)
BILIRUBIN TOTAL: 1 mg/dL (ref 0.3–1.2)
BUN: 6 mg/dL (ref 6–20)
CALCIUM: 8.9 mg/dL (ref 8.9–10.3)
CO2: 22 mmol/L (ref 22–32)
Chloride: 107 mmol/L (ref 101–111)
Creatinine, Ser: 0.68 mg/dL (ref 0.44–1.00)
GFR calc non Af Amer: >60 mL/min (ref >60)
Glucose, Bld: 73 mg/dL (ref 65–99)
Potassium: 3.8 mmol/L (ref 3.5–5.1)
Sodium: 137 mmol/L (ref 135–145)
TOTAL PROTEIN: 7.1 g/dL (ref 6.5–8.1)

## 2017-05-01 LAB — CBC
HEMATOCRIT: 34.2 % — AB (ref 36.0–46.0)
HEMOGLOBIN: 10.1 g/dL — AB (ref 12.0–15.0)
MCH: 19.8 pg — AB (ref 26.0–34.0)
MCHC: 29.5 g/dL — ABNORMAL LOW (ref 30.0–36.0)
MCV: 67.1 fL — ABNORMAL LOW (ref 78.0–100.0)
Platelets: 262 10*3/uL (ref 150–400)
RBC: 5.1 MIL/uL (ref 3.87–5.11)
RDW: 18.9 % — AB (ref 11.5–15.5)
WBC: 9.6 10*3/uL (ref 4.0–10.5)

## 2017-05-01 LAB — PROTEIN / CREATININE RATIO, URINE
CREATININE, URINE: 272 mg/dL
Protein Creatinine Ratio: 0.06 mg/mg{Cre} (ref 0.00–0.15)
TOTAL PROTEIN, URINE: 16 mg/dL

## 2017-05-01 NOTE — MAU Provider Note (Signed)
Chief Complaint:  No chief complaint on file.   None     HPI: Rhonda Blevins is a 21 y.o. G2P1 [facility] 49w0d[facility] reporting pelvic pain while sitting on the toilet and mucus discharge noted when wiping.  She denies any regular contractions. She reports good fetal movement.  She has not tried any treatments. There are no associated symptoms. She denies vaginal bleeding, vaginal itching/burning, urinary symptoms, h/a, dizziness, n/v, or fever/chills.    HPI  Past Medical History: No past medical history on file.  Past obstetric history: OB History  Gravida Para Term Preterm AB Living  _0 SAB TAB Ectopic Multiple Live Births               # Outcome Date GA Lbr Len/2nd Weight Sex Delivery Anes PTL Lv  2 Current           1 Para               Past Surgical History: Past Surgical History:  Procedure Laterality Date  . FEMUR IM NAIL Left 10/20/2016   Procedure: INTRAMEDULLARY (IM) NAIL FEMORAL;  Surgeon: NLeandrew Koyanagi MD;  [facility]  Service: Orthopedics;  Laterality: Left;    Family History: No family history on file.  Social History: Social History  Substance Use Topics  . Smoking status: Never Smoker  . Smokeless tobacco: Never Used  . Alcohol use Not on file    Allergies: No Known Allergies  Meds:  Prescriptions Prior to Admission  Medication Sig Dispense Refill Last Dose  . enoxaparin (LOVENOX) 40 MG/0.4ML injection Inject 0.4 mLs (40 mg total) into the skin daily. 14 Syringe 0   . methocarbamol (ROBAXIN) 750 MG tablet Take 1 tablet (750 mg total) by mouth 2 (two) times daily as needed for muscle spasms. 60 tablet 0   . ondansetron (ZOFRAN) 4 MG tablet Take 1-2 tablets (4-8 mg total) by mouth every 8 (eight) hours as needed for nausea or vomiting. 40 tablet 0   . oxyCODONE (OXY IR/ROXICODONE) 5 MG immediate release tablet Take 1-3 tablets (5-15 mg total) by mouth every 4 (four) hours as needed.  90 tablet 0   . Prenatal Vit-Fe Fumarate-FA (MULTIVITAMIN-PRENATAL) 27-0.8 MG TABS tablet Take 1 tablet by mouth daily at 12 noon.   10/18/2016 at Unknown time  . senna-docusate (SENOKOT S) 8.6-50 MG tablet Take 1 tablet by mouth at bedtime as needed. 30 tablet 1     ROS:  Review of Systems  Constitutional: Negative for chills, fatigue and fever.  Eyes: Negative for visual disturbance.  Respiratory: Negative for shortness of breath.   Cardiovascular: Negative for chest pain.  Gastrointestinal: Negative for abdominal pain, nausea and vomiting.  Genitourinary: Positive for pelvic pain and vaginal discharge. Negative for difficulty urinating, dysuria, flank pain, vaginal bleeding and vaginal pain.  Neurological: Negative for dizziness and headaches.  Psychiatric/Behavioral: Negative.      I have reviewed patient's Past Medical Hx, Surgical Hx, Family Hx, Social Hx, medications and allergies.   Physical Exam  Patient Vitals for the past 24 hrs:  BP Temp Temp src Pulse Resp Height Weight  05/01/17 1946 139/74 - - 96 - - -  05/01/17 1937 140/80 - - 90 - - -  05/01/17 1921 140/78 99 F (37.2 C) Oral 98 18 _1  (1.651 m) 171 lb (77.6 kg)   Constitutional: Well-developed, well-nourished female  in no acute distress.  Cardiovascular: normal rate Respiratory: normal effort GI: Abd soft, non-tender, gravid appropriate for gestational age.  MS: Extremities nontender, no edema, normal ROM Neurologic: Alert and oriented x 4.  GU: Neg CVAT.   Dilation: 3 Effacement (%): 60 Cervical Position: Posterior Station: -3 Exam by:: Millner, RN   FHT:  Baseline 145, moderate variability, accelerations present (only 1 15x15 in 45 minutes), no decelerations Contractions: q 7-8 mins   Labs: No results found for this or any previous visit (from the past 24 hour(s)). --/--/A POS, A POS (12/08 1940)  Imaging:  No results found.  MAU Course/MDM: Initially pt was evaluated by RN for term labor  evaluation but when tracing not reactive, CNM notified and to bedside to evaluate pt NST reviewed and only one 15x15 accel in 45 minutes so BPP ordered  RN to recheck cervix in 1 hour  Report to Kathrine Haddock, CNM, with BPP pending   Fatima Blank Certified Nurse-Midwife 05/01/2017 8:58 PM   Upon potential discharge, noted two blood pressures met 211 systolic; ordered Prex labs and notified N. Baron Sane who assumes care of patient.  Gwen Pounds, CNM   Pre-eclampsia labs reviewed as well as BPP. Patients BPP was 8/8. Pre-eclampsia labs all within normal limits. Patient has been in for 4 hours and has not had two elevated BP's 4 hours apart will D/C at this time.   DX: Elevated BP in pregnancy: will monitor as an outpatient. Given precautions Non-reactive NST: BPP 8/8 follow up in office.  Jacquiline Doe, MD 05/01/17 11:50 PM

## 2017-05-01 NOTE — Discharge Instructions (Signed)
Hypertension During Pregnancy Hypertension is also called high blood pressure. High blood pressure means that the force of your blood moving in your body is too strong. When you are pregnant, this condition should be watched carefully. It can cause problems for you and your baby. Follow these instructions at home: Eating and drinking  Drink enough fluid to keep your pee (urine) clear or pale yellow.  Eat healthy foods that are low in salt (sodium). ? Do not add salt to your food. ? Check labels on foods and drinks to see much salt is in them. Look on the label where you see "Sodium." Lifestyle  Do not use any products that contain nicotine or tobacco, such as cigarettes and e-cigarettes. If you need help quitting, ask your doctor.  Do not use alcohol.  Avoid caffeine.  Avoid stress. Rest and get plenty of sleep. General instructions  Take over-the-counter and prescription medicines only as told by your doctor.  While lying down, lie on your left side. This keeps pressure off your baby.  While sitting or lying down, raise (elevate) your feet. Try putting some pillows under your lower legs.  Exercise regularly. Ask your doctor what kinds of exercise are best for you.  Keep all prenatal and follow-up visits as told by your doctor. This is important. Contact a doctor if:  You have symptoms that your doctor told you to watch for, such as: ? Fever. ? Throwing up (vomiting). ? Headache. Get help right away if:  You have very bad pain in your belly (abdomen).  You are throwing up, and this does not get better with treatment.  You suddenly get swelling in your hands, ankles, or face.  You gain 4 lb (1.8 kg) or more in 1 week.  You get bleeding from your vagina.  You have blood in your pee.  You do not feel your baby moving as much as normal.  You have a change in vision.  You have muscle twitching or sudden tightening (spasms).  You have trouble breathing.  Your lips  or fingernails turn blue. This information is not intended to replace advice given to you by your health care provider. Make sure you discuss any questions you have with your health care provider. Document Released: 12/01/2010 Document Revised: 07/10/2016 Document Reviewed: 07/10/2016 Elsevier Interactive Patient Education  2017 [employer]  

## 2017-09-16 ENCOUNTER — Encounter (HOSPITAL_COMMUNITY): Payer: Self-pay
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
## 2019-04-10 IMAGING — US US MFM FETAL BPP W/O NON-STRESS
1 series · 14 of 28 positions shown · non-contrast
Comparison: none

[Series 1: us mfm fetal bpp w/o non-stress · 38 acquisitions, 14 frames shown]
[im 2/38]
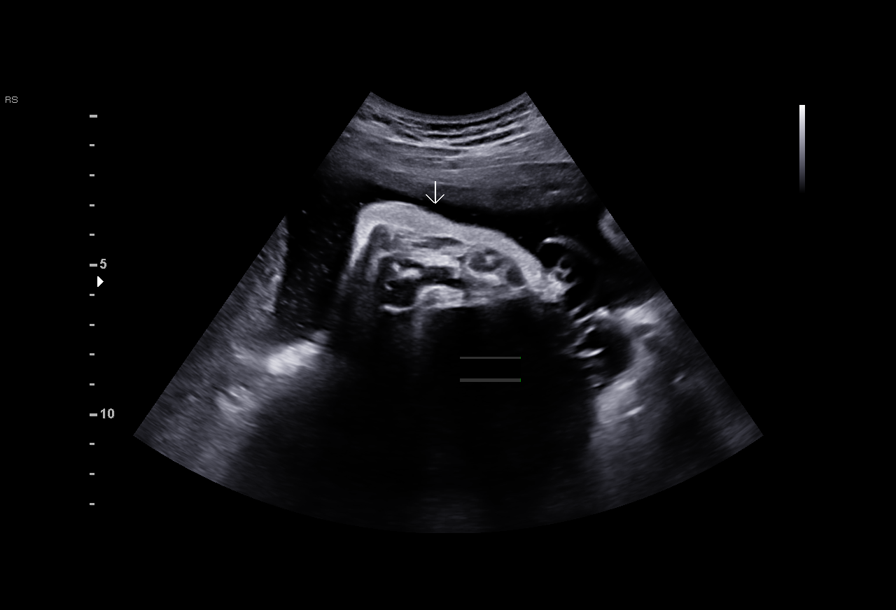
[im 5/38]
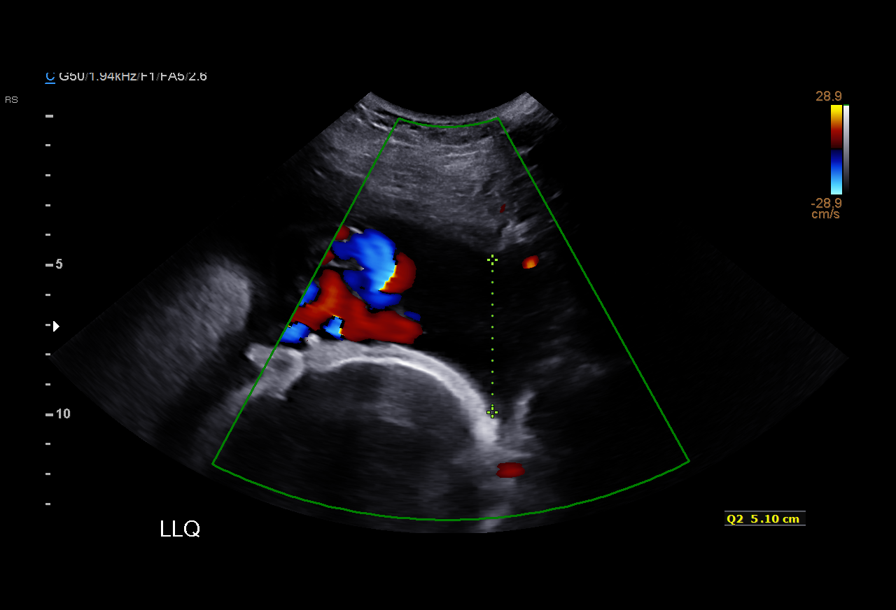
[im 7/38]
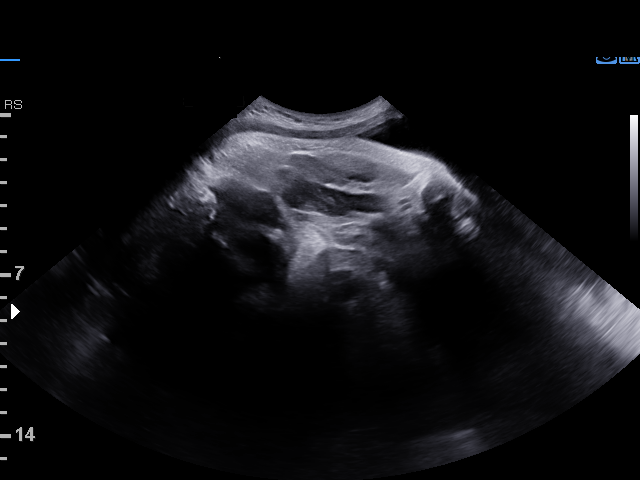
[im 10/38]
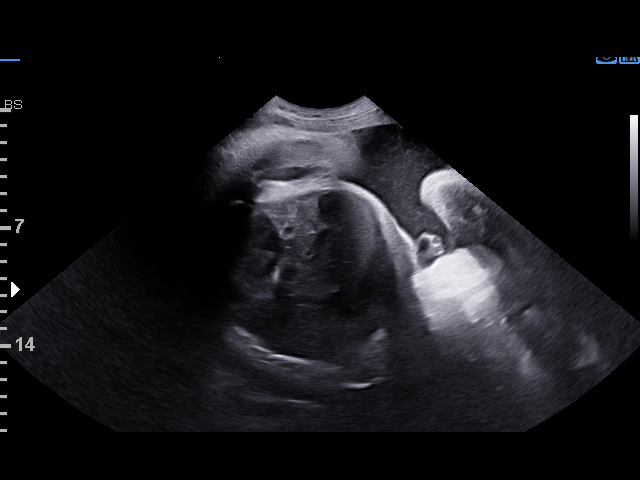
[im 13/38]
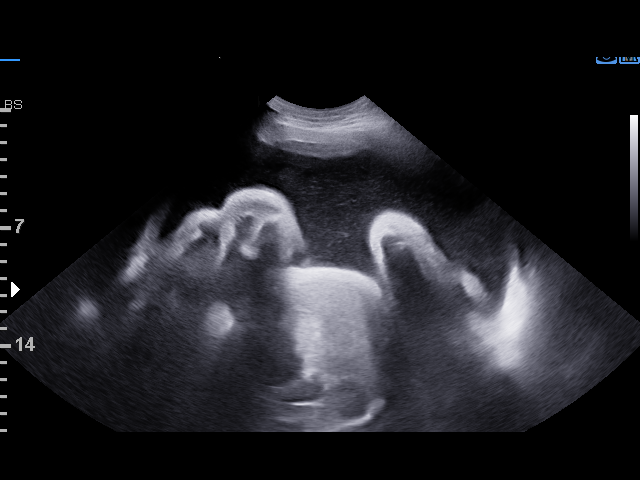
[im 16/38]
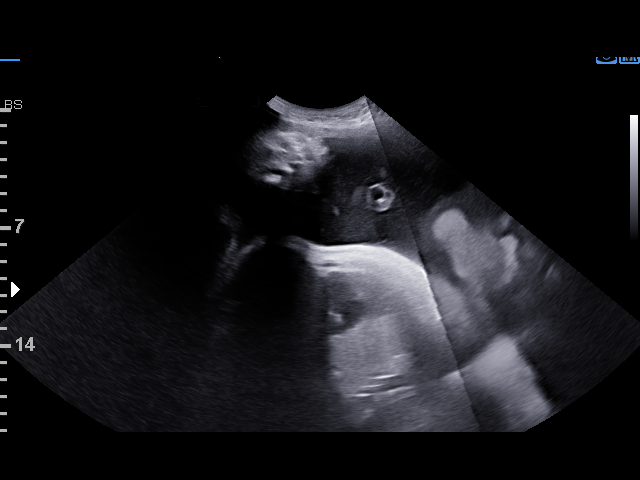
[im 18/38]
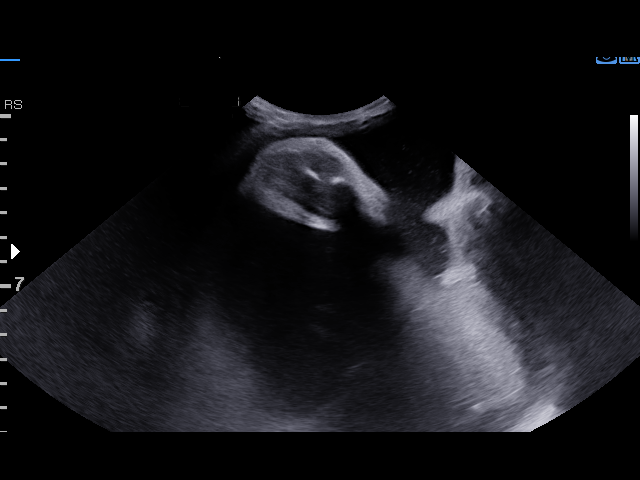
[im 21/38]
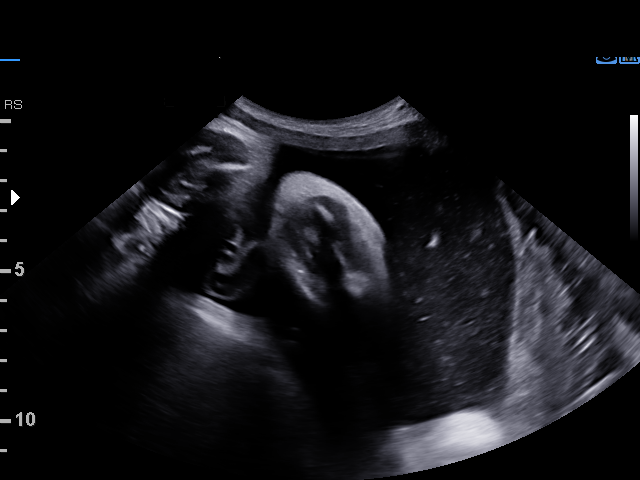
[im 24/38]
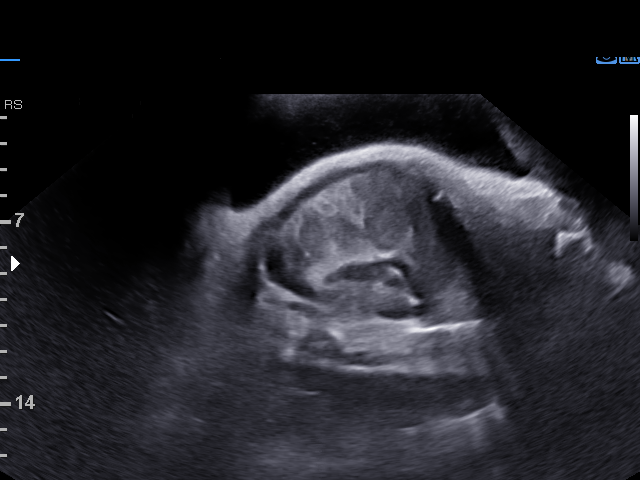
[im 27/38]
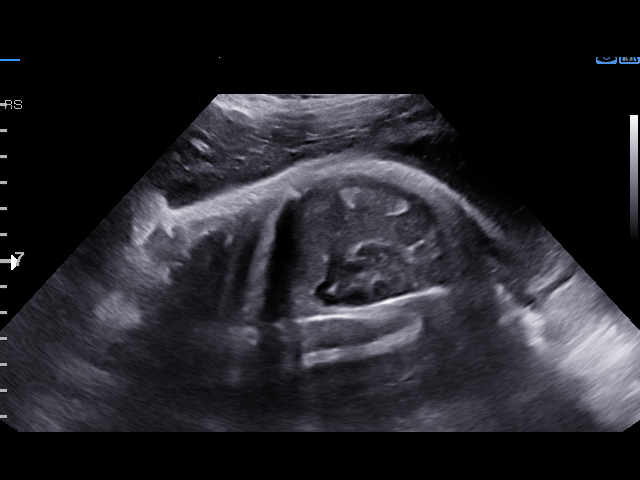
[im 29/38]
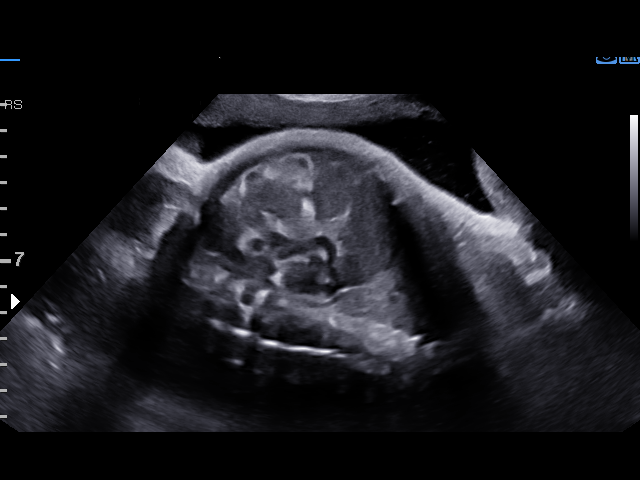
[im 32/38]
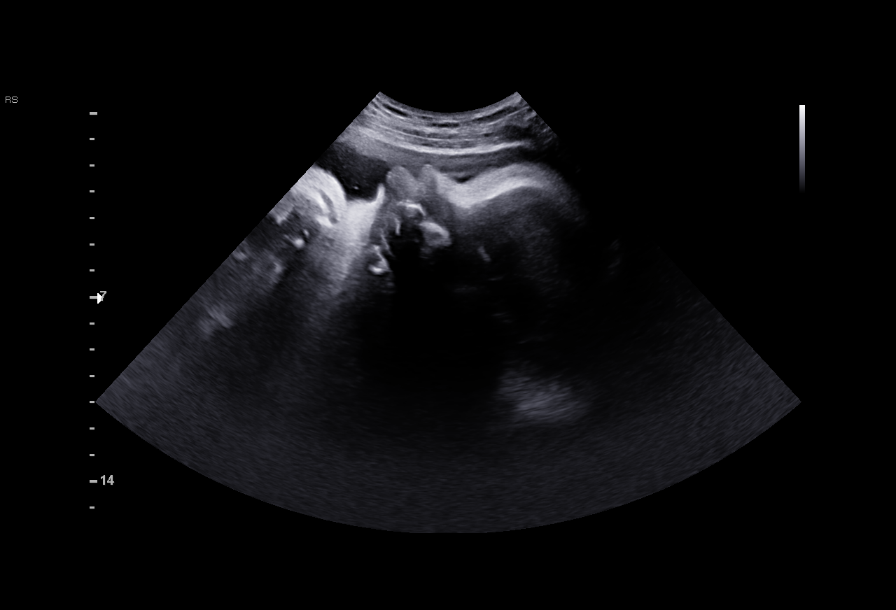
[im 35/38]
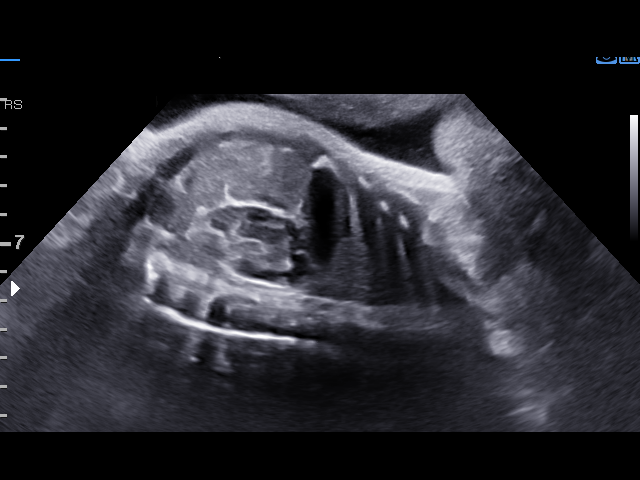
[im 38/38]
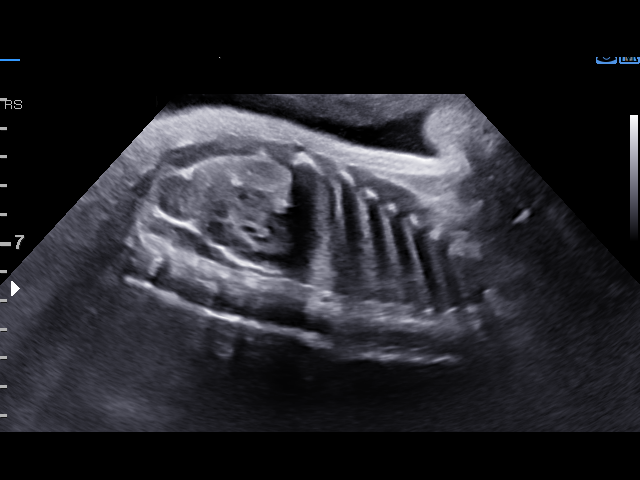

[14 of 28 positions shown; findings below may reference images not displayed]

MAU/Triage

Indications

38 weeks gestation of pregnancy
Non-reactive NST
OB History

Gravidity:    2         Term:   1
Living:       1
Fetal Evaluation

Num Of Fetuses:     1
Fetal Heart         155
Rate(bpm):
Cardiac Activity:   Observed
Presentation:       Cephalic

Amniotic Fluid
AFI FV:      Subjectively within normal limits

AFI Sum(cm)     %Tile       Largest Pocket(cm)
21.23           84

RUQ(cm)       RLQ(cm)       LUQ(cm)        LLQ(cm)
7.01
Biophysical Evaluation

Amniotic F.V:   Pocket => 2 cm two         F. Tone:        Observed
planes
F. Movement:    Observed                   Score:          [DATE]
F. Breathing:   Observed
Gestational Age

LMP:           38w 0d        Date:  08/08/16                 EDD:   05/15/17
Best:          38w 0d     Det. By:  LMP  (08/08/16)          EDD:   05/15/17
Impression

IUP at  38+0 weeks with nonreactive NST
Normal amniotic fluid volume
BPP [DATE]
Recommendations

Follow-up ultrasounds as clinically indicated.

## 2019-05-25 ENCOUNTER — Telehealth: Payer: Self-pay | Admitting: Orthopaedic Surgery

## 2019-05-25 NOTE — Telephone Encounter (Signed)
Patient called stated needed letter date she got surgery and what the operation was on.  Please call patient back [phone] Suggested Medical Records to patient. She wanted note sent to Dr. Erlinda Hong.

## 2019-05-25 NOTE — Telephone Encounter (Signed)
Generate note.

## 2019-05-25 NOTE — Telephone Encounter (Signed)
Tried to call patient multiple times and line was busy, was finally able to get through LM advising [employer] to print op note for patient as this has date of surgery as well as surgery performed. However, more information is needed, will need to go through medical records.  Advised her to call and let us know how she wished to proceed.

## 2019-05-26 ENCOUNTER — Telehealth: Payer: Self-pay | Admitting: Orthopaedic Surgery

## 2019-05-26 NOTE — Telephone Encounter (Signed)
IC patient. She will pick up at front desk

## 2019-05-26 NOTE — Telephone Encounter (Signed)
Patient called advised the op note from surgery will be enough for now. The number to contact patient is [phone]

## 2019-05-26 NOTE — Telephone Encounter (Signed)
Can you please send this to her.

## 2019-07-06 LAB — OB RESULTS CONSOLE HEPATITIS B SURFACE ANTIGEN: Hepatitis B Surface Ag: NEGATIVE

## 2019-07-06 LAB — OB RESULTS CONSOLE HIV ANTIBODY (ROUTINE TESTING): HIV: NONREACTIVE

## 2019-07-06 LAB — OB RESULTS CONSOLE HGB/HCT, BLOOD
HCT: 36 (ref 29–41)
Hemoglobin: 11.4

## 2019-07-06 LAB — OB RESULTS CONSOLE ABO/RH: RH Type: POSITIVE

## 2019-07-06 LAB — OB RESULTS CONSOLE RUBELLA ANTIBODY, IGM
Rubella: IMMUNE
Rubella: IMMUNE

## 2019-07-06 LAB — OB RESULTS CONSOLE TSH: TSH: 0.89

## 2019-07-06 LAB — OB RESULTS CONSOLE ANTIBODY SCREEN: Antibody Screen: NEGATIVE

## 2019-07-06 LAB — OB RESULTS CONSOLE GC/CHLAMYDIA
Chlamydia: NEGATIVE
Gonorrhea: NEGATIVE

## 2019-07-06 LAB — OB RESULTS CONSOLE PLATELET COUNT: Platelets: 249

## 2019-07-06 LAB — OB RESULTS CONSOLE RPR: RPR: NONREACTIVE

## 2019-09-02 LAB — OB RESULTS CONSOLE HGB/HCT, BLOOD
HCT: 31 (ref 29–41)
Hemoglobin: 10.1

## 2019-09-02 LAB — OB RESULTS CONSOLE HEPATITIS B SURFACE ANTIGEN: Hepatitis B Surface Ag: NEGATIVE

## 2019-09-02 LAB — OB RESULTS CONSOLE HIV ANTIBODY (ROUTINE TESTING): HIV: NONREACTIVE

## 2019-09-02 LAB — OB RESULTS CONSOLE PLATELET COUNT: Platelets: 271

## 2019-09-02 LAB — OB RESULTS CONSOLE RUBELLA ANTIBODY, IGM: Rubella: IMMUNE

## 2019-09-02 LAB — OB RESULTS CONSOLE VARICELLA ZOSTER ANTIBODY, IGG: Varicella: NON-IMMUNE/NOT IMMUNE

## 2019-09-02 LAB — OB RESULTS CONSOLE RPR: RPR: NONREACTIVE

## 2019-10-14 LAB — OB RESULTS CONSOLE GC/CHLAMYDIA
Chlamydia: NEGATIVE
Gonorrhea: NEGATIVE

## 2019-11-11 ENCOUNTER — Encounter: Payer: Self-pay | Admitting: General Practice

## 2019-11-13 NOTE — L&D Delivery Note (Signed)
    Delivery Note  Rhonda Blevins is a 24 y.o. female G3P2001 with IUP at [redacted]w[redacted]d with unplanned precipitous delivery at home.   Cord clamping delayed by 30 minutes to 1 hour and pt initially planning a Lotus birth but after further discussion, agreed to clamp and cut cord.  There is light bleeding and mild abdominal cramping. The placenta, per the patient, delivered within a few minutes and umbilical cord is attached to infant upon arrival, with placenta in a bag.    Placenta appears intact with 3 vessel cord and no abnormalities noted.    Anesthesia:  None Episiotomy:  None Lacerations:  None, intact perineum Suture Repair: n/a Est. Blood Loss (mL):  Unable to estimate but bleeding upon arrival to MAU less than 200 ml estimated  Mom and baby stable prior to transfer to postpartum. She plans on breastfeeding. She declines birth control.    Sharen Counter 01/24/2020, 9:34 AM

## 2019-11-23 ENCOUNTER — Encounter: Payer: Self-pay | Admitting: *Deleted

## 2019-11-23 DIAGNOSIS — Z3480 Encounter for supervision of other normal pregnancy, unspecified trimester: Secondary | ICD-10-CM | POA: Insufficient documentation

## 2019-11-24 ENCOUNTER — Other Ambulatory Visit (INDEPENDENT_AMBULATORY_CARE_PROVIDER_SITE_OTHER): Payer: Medicaid Other | Admitting: *Deleted

## 2019-11-24 ENCOUNTER — Other Ambulatory Visit: Payer: Self-pay

## 2019-11-24 DIAGNOSIS — Z3480 Encounter for supervision of other normal pregnancy, unspecified trimester: Secondary | ICD-10-CM

## 2019-11-24 DIAGNOSIS — F1291 Cannabis use, unspecified, in remission: Secondary | ICD-10-CM

## 2019-11-24 DIAGNOSIS — Z87898 Personal history of other specified conditions: Secondary | ICD-10-CM

## 2019-11-24 MED ORDER — BLOOD PRESSURE MONITOR AUTOMAT DEVI: 1.0000 | Freq: Every day | 0 refills | Status: AC

## 2019-11-24 MED ORDER — GOJJI WEIGHT SCALE MISC: 1.0000 | Freq: Every day | 0 refills | Status: AC | PRN

## 2019-11-24 NOTE — Progress Notes (Signed)
   Patient in clinic for 28 week labs. Rx for BP cuff and scale to sent to pharmacy.  Clovis Pu, RN

## 2019-11-25 ENCOUNTER — Telehealth: Payer: Self-pay | Admitting: *Deleted

## 2019-11-25 DIAGNOSIS — O99019 Anemia complicating pregnancy, unspecified trimester: Secondary | ICD-10-CM

## 2019-11-25 LAB — GLUCOSE TOLERANCE, 2 HOURS W/ 1HR
Glucose, 1 hour: 122 mg/dL (ref 65–179)
Glucose, 2 hour: 132 mg/dL (ref 65–152)
Glucose, Fasting: 89 mg/dL (ref 65–91)

## 2019-11-25 LAB — RPR: RPR Ser Ql: NONREACTIVE

## 2019-11-25 LAB — CBC
Hematocrit: 31.8 % — ABNORMAL LOW (ref 34.0–46.6)
Hemoglobin: 9.3 g/dL — ABNORMAL LOW (ref 11.1–15.9)
MCH: 19 pg — ABNORMAL LOW (ref 26.6–33.0)
MCHC: 29.2 g/dL — ABNORMAL LOW (ref 31.5–35.7)
MCV: 65 fL — ABNORMAL LOW (ref 79–97)
Platelets: 303 10*3/uL (ref 150–450)
RBC: 4.9 x10E6/uL (ref 3.77–5.28)
RDW: 18 % — ABNORMAL HIGH (ref 11.7–15.4)
WBC: 9.4 10*3/uL (ref 3.4–10.8)

## 2019-11-25 LAB — HIV ANTIBODY (ROUTINE TESTING W REFLEX): HIV Screen 4th Generation wRfx: NONREACTIVE

## 2019-11-25 MED ORDER — FERROUS SULFATE 325 (65 FE) MG PO TABS: 325.0000 mg | ORAL_TABLET | Freq: Two times a day (BID) | ORAL | 3 refills | Status: AC

## 2019-11-25 NOTE — Telephone Encounter (Signed)
-----   Message from [place], [place] sent at 11/25/2019 12:51 PM EST ----- Regarding: FW: Please start her on FeSO4 325 mg BID. ----- Message ----- From: Interface, [facility] Lab Results In Sent: 11/25/2019   7:38 AM EST To: Raelyn Mora, CNM

## 2019-12-03 ENCOUNTER — Other Ambulatory Visit (HOSPITAL_COMMUNITY)
Admission: RE | Admit: 2019-12-03 | Discharge: 2019-12-03 | Disposition: A | Discharge status: Home / Self Care | Payer: Medicaid Other | Source: Ambulatory Visit | Attending: Obstetrics and Gynecology | Admitting: Obstetrics and Gynecology

## 2019-12-03 ENCOUNTER — Encounter: Payer: Self-pay | Admitting: General Practice

## 2019-12-03 ENCOUNTER — Other Ambulatory Visit: Payer: Self-pay

## 2019-12-03 ENCOUNTER — Encounter: Payer: Self-pay | Admitting: Obstetrics and Gynecology

## 2019-12-03 ENCOUNTER — Ambulatory Visit (INDEPENDENT_AMBULATORY_CARE_PROVIDER_SITE_OTHER): Payer: Medicaid Other | Admitting: Obstetrics and Gynecology

## 2019-12-03 VITALS — BP 128/72 | HR 111 | Temp 98.0°F | Wt 166.0 lb

## 2019-12-03 DIAGNOSIS — O99013 Anemia complicating pregnancy, third trimester: Secondary | ICD-10-CM | POA: Diagnosis not present

## 2019-12-03 DIAGNOSIS — O26899 Other specified pregnancy related conditions, unspecified trimester: Secondary | ICD-10-CM | POA: Insufficient documentation

## 2019-12-03 DIAGNOSIS — Z3480 Encounter for supervision of other normal pregnancy, unspecified trimester: Secondary | ICD-10-CM

## 2019-12-03 DIAGNOSIS — O26893 Other specified pregnancy related conditions, third trimester: Secondary | ICD-10-CM | POA: Diagnosis not present

## 2019-12-03 DIAGNOSIS — Z3A32 32 weeks gestation of pregnancy: Secondary | ICD-10-CM

## 2019-12-03 DIAGNOSIS — N898 Other specified noninflammatory disorders of vagina: Secondary | ICD-10-CM

## 2019-12-03 DIAGNOSIS — N9089 Other specified noninflammatory disorders of vulva and perineum: Secondary | ICD-10-CM | POA: Insufficient documentation

## 2019-12-03 DIAGNOSIS — O99019 Anemia complicating pregnancy, unspecified trimester: Secondary | ICD-10-CM

## 2019-12-03 NOTE — Patient Instructions (Signed)
Anemia  Anemia is a condition in which you do not have enough red blood cells or hemoglobin. Hemoglobin is a substance in red blood cells that carries oxygen. When you do not have enough red blood cells or hemoglobin (are anemic), your body cannot get enough oxygen and your organs may not work properly. As a result, you may feel very tired or have other problems. What are the causes? Common causes of anemia include:  Excessive bleeding. Anemia can be caused by excessive bleeding inside or outside the body, including bleeding from the intestine or from periods in women.  Poor nutrition.  Long-lasting (chronic) kidney, thyroid, and liver disease.  Bone marrow disorders.  Cancer and treatments for cancer.  HIV (human immunodeficiency virus) and AIDS (acquired immunodeficiency syndrome).  Treatments for HIV and AIDS.  Spleen problems.  Blood disorders.  Infections, medicines, and autoimmune disorders that destroy red blood cells. What are the signs or symptoms? Symptoms of this condition include:  Minor weakness.  Dizziness.  Headache.  Feeling heartbeats that are irregular or faster than normal (palpitations).  Shortness of breath, especially with exercise.  Paleness.  Cold sensitivity.  Indigestion.  Nausea.  Difficulty sleeping.  Difficulty concentrating. Symptoms may occur suddenly or develop slowly. If your anemia is mild, you may not have symptoms. How is this diagnosed? This condition is diagnosed based on:  Blood tests.  Your medical history.  A physical exam.  Bone marrow biopsy. Your health care provider may also check your stool (feces) for blood and may do additional testing to look for the cause of your bleeding. You may also have other tests, including:  Imaging tests, such as a CT scan or MRI.  Endoscopy.  Colonoscopy. How is this treated? Treatment for this condition depends on the cause. If you continue to lose a lot of blood, you may  need to be treated at a hospital. Treatment may include:  Taking supplements of iron, vitamin S31, or folic acid.  Taking a hormone medicine (erythropoietin) that can help to stimulate red blood cell growth.  Having a blood transfusion. This may be needed if you lose a lot of blood.  Making changes to your diet.  Having surgery to remove your spleen. Follow these instructions at home:  Take over-the-counter and prescription medicines only as told by your health care provider.  Take supplements only as told by your health care provider.  Follow any diet instructions that you were given.  Keep all follow-up visits as told by your health care provider. This is important. Contact a health care provider if:  You develop new bleeding anywhere in the body. Get help right away if:  You are very weak.  You are short of breath.  You have pain in your abdomen or chest.  You are dizzy or feel faint.  You have trouble concentrating.  You have bloody or black, tarry stools.  You vomit repeatedly or you vomit up blood. Summary  Anemia is a condition in which you do not have enough red blood cells or enough of a substance in your red blood cells that carries oxygen (hemoglobin).  Symptoms may occur suddenly or develop slowly.  If your anemia is mild, you may not have symptoms.  This condition is diagnosed with blood tests as well as a medical history and physical exam. Other tests may be needed.  Treatment for this condition depends on the cause of the anemia. This information is not intended to replace advice given to you by  your health care provider. Make sure you discuss any questions you have with your health care provider. Document Revised: 10/11/2017 Document Reviewed: 11/30/2016 Elsevier Patient Education  [place]. Iron-Rich Diet  Iron is a mineral that helps your body to produce hemoglobin. Hemoglobin is a protein in red blood cells that carries oxygen to your  body's tissues. Eating too little iron may cause you to feel weak and tired, and it can increase your risk of infection. Iron is naturally found in many foods, and many foods have iron added to them (iron-fortified foods). You may need to follow an iron-rich diet if you do not have enough iron in your body due to certain medical conditions. The amount of iron that you need each day depends on your age, your sex, and any medical conditions you have. Follow instructions from your health care provider or a diet and nutrition specialist (dietitian) about how much iron you should eat each day. What are tips for following this plan? Reading food labels  Check food labels to see how many milligrams (mg) of iron are in each serving. Cooking  Cook foods in pots and pans that are made from iron.  Take these steps to make it easier for your body to absorb iron from certain foods: ? Soak beans overnight before cooking. ? Soak whole grains overnight and drain them before using. ? Ferment flours before baking, such as by using yeast in bread dough. Meal planning  When you eat foods that contain iron, you should eat them with foods that are high in vitamin C. These include oranges, peppers, tomatoes, potatoes, and mango. Vitamin C helps your body to absorb iron. General information  Take iron supplements only as told by your health care provider. An overdose of iron can be life-threatening. If you were prescribed iron supplements, take them with orange juice or a vitamin C supplement.  When you eat iron-fortified foods or take an iron supplement, you should also eat foods that naturally contain iron, such as meat, poultry, and fish. Eating naturally iron-rich foods helps your body to absorb the iron that is added to other foods or contained in a supplement.  Certain foods and drinks prevent your body from absorbing iron properly. Avoid eating these foods in the same meal as iron-rich foods or with iron  supplements. These foods include: ? Coffee, black tea, and red wine. ? Milk, dairy products, and foods that are high in calcium. ? Beans and soybeans. ? Whole grains. What foods should I eat? Fruits Prunes. Raisins. Eat fruits high in vitamin C, such as oranges, grapefruits, and strawberries, alongside iron-rich foods. Vegetables Spinach (cooked). Green peas. Broccoli. Fermented vegetables. Eat vegetables high in vitamin C, such as leafy greens, potatoes, bell peppers, and tomatoes, alongside iron-rich foods. Grains Iron-fortified breakfast cereal. Iron-fortified whole-wheat bread. Enriched rice. Sprouted grains. Meats and other proteins Beef liver. Oysters. Beef. Shrimp. [place]. Chicken. [place]. Sardines. Chickpeas. Nuts. Tofu. Pumpkin seeds. Beverages Tomato juice. Fresh orange juice. Prune juice. Hibiscus tea. Fortified instant breakfast shakes. Sweets and desserts Blackstrap molasses. Seasonings and condiments Tahini. Fermented soy sauce. Other foods Wheat germ. The items listed above may not be a complete list of recommended foods and beverages. Contact a dietitian for more information. What foods should I avoid? Grains Whole grains. Bran cereal. Bran flour. Oats. Meats and other proteins Soybeans. Products made from soy protein. Black beans. Lentils. Mung beans. Split peas. Dairy Milk. Cream. Cheese. Yogurt. Cottage cheese. Beverages Coffee. Black tea. Red wine.  Sweets and desserts Cocoa. Chocolate. Ice cream. Other foods Basil. Oregano. Large amounts of parsley. The items listed above may not be a complete list of foods and beverages to avoid. Contact a dietitian for more information. Summary  Iron is a mineral that helps your body to produce hemoglobin. Hemoglobin is a protein in red blood cells that carries oxygen to your body's tissues.  Iron is naturally found in many foods, and many foods have iron added to them (iron-fortified foods).  When you eat foods that  contain iron, you should eat them with foods that are high in vitamin C. Vitamin C helps your body to absorb iron.  Certain foods and drinks prevent your body from absorbing iron properly, such as whole grains and dairy products. You should avoid eating these foods in the same meal as iron-rich foods or with iron supplements. This information is not intended to replace advice given to you by your health care provider. Make sure you discuss any questions you have with your health care provider. Document Revised: 10/11/2017 Document Reviewed: 09/24/2017 Elsevier Patient Education  2020 [employer].

## 2019-12-03 NOTE — Progress Notes (Signed)
INITIAL OBSTETRICAL VISIT Patient name: Rhonda Blevins MRN [id number]  Date of birth: 1996-05-10 Chief Complaint:   Initial Prenatal Visit  History of Present Illness:   Rhonda Blevins is a 24 y.o. G53P2001 African American female at [redacted]w[redacted]d by LMP with an Estimated Date of Delivery: 01/28/20 being seen today for her initial obstetrical visit.  Her obstetrical history is significant for anemia. This is a planned pregnancy. She and the father of the baby (FOB) "Zhanell" live together. She has a support system that consists of her FOB, family and friends.  She received prenatal care at Dr. Cyndie Chime office in [place], [place] and with [facility]. Her pregnancy history is significant for 2 term vaginal deliveries (June 24, 2014 and 05/09/2017). She had one child to die from SIDS at the age of 37 months Mar 21, 2017). Today she reports vaginal irritation and increased vaginal discharge. Tested (+) for trichomonas in this pregnancy and was properly treated. TOC was on 10/14/2019.  Patient's last menstrual period was 04/01/2019 (approximate). Last pap 03/21/18 per pt. Results were: no h/o abnormal pap smear per pt Review of Systems:   Pertinent items are noted in HPI Denies cramping/contractions, leakage of fluid, vaginal bleeding, abnormal vaginal discharge w/ itching/odor/irritation, headaches, visual changes, shortness of breath, chest pain, abdominal pain, severe nausea/vomiting, or problems with urination or bowel movements unless otherwise stated above.  Pertinent History Reviewed:  Reviewed past medical,surgical, social, obstetrical and family history.  Reviewed problem list, medications and allergies. OB History  Gravida Para Term Preterm AB Living  3 2 2     1   SAB TAB Ectopic Multiple Live Births          2    # Outcome Date GA Lbr Len/2nd Weight Sex Delivery Anes PTL Lv  3 Current           2 Term 05/09/17 [redacted]w[redacted]d  7 lb 11 oz (3.487 kg)  Vag-Spont EPI  DEC     Birth Comments: Sudden infant death  syndrome Mar 21, 2017  1 Term 2014-06-24 [redacted]w[redacted]d  6 lb 11 oz (3.033 kg) F Vag-Spont EPI N LIV   Physical Assessment:   Vitals:   12/03/19 0937  BP: 128/72  Pulse: (!) 111  Temp: 98 F (36.7 C)  Weight: 166 lb (75.3 kg)  Body mass index is 27.62 kg/m.       Physical Examination:  General appearance - well appearing, and in no distress  Mental status - alert, oriented to person, place, and time  Psych:  She has a normal mood and affect  Skin - warm and dry, normal color, no suspicious lesions noted  Chest - effort normal, all lung fields clear to auscultation bilaterally  Heart - normal rate and regular rhythm  Abdomen - soft, nontender  Extremities:  No swelling or varicosities noted  Pelvic - VULVA: normal appearing vulva with no masses, tenderness or lesions.  Clitoromegaly. VAGINA: Wet prep obtained by blind swab  Thin prep pap is not done - will defer to 36 wks visit   No results found for this or any previous visit (from the past 24 hour(s)).  Assessment & Plan:  1) Low-Risk Pregnancy G3P2001 at [redacted]w[redacted]d with an Estimated Date of Delivery: 01/28/20   2) Initial OB visit  3) Supervision of other normal pregnancy, antepartum  - Welcomed to practice and introduced self to patient in addition to discussing other advanced practice providers that she may be seeing at this practice - Anticipatory guidance on upcoming appointments - Educated  on COVID19 and pregnancy and the integration of virtual appointments  - Educated on babyscripts app- patient reports she has not received email, encouraged to look in spam folder and to call office if she still has not received email - patient verbalizes understanding - Elevated BP noted on prenatal record from Dr. Tawni Levy office not taking ASA  4) Anemia of mother in pregnancy, third trimester - Advised patient that we need to increase her iron level before delivery; which only leaves 8 wks - Offered Feraheme infusions x 2. Explained how this is a very  useful way to increase the hemoglobin rapidly -- patient declined - Advised to start taking  FeSO4 BID as previously prescribed. Patient agreed to take iron supplements. - Information provided on anemia and iron rich diet   5) Vaginal discharge during pregnancy, antepartum  - Cervicovaginal ancillary only( [place])   Meds: No orders of the defined types were placed in this encounter.   Initial labs obtained Continue prenatal vitamins Reviewed n/v relief measures and warning s/s to report Reviewed recommended weight gain based on pre-gravid BMI Encouraged well-balanced diet Genetic Screening discussed: declined Cystic fibrosis, SMA, Fragile X screening discussed declined The nature of [facility] with multiple MDs and other Advanced Practice Providers was explained to patient; also emphasized that residents, students are part of our team.  Discussed optimized OB schedule and video visits. Advised can have an in-office visit whenever she feels she needs to be seen.  Does not have own BP cuff. BP cuff Rx faxed today. Explained to patient that BP will be mailed to her house. Check BP weekly, let us know if >140/90. Advised to call during normal business hours and there is an after-hours nurse line available.   Follow-up: Return in about 4 weeks (around 12/31/2019) for Return OB w/GBS & Pap.   Orders Placed This Encounter  Procedures  . OB RESULTS CONSOLE GC/Chlamydia  . OB RESULTS CONSOLE RPR  . OB RESULTS CONSOLE HIV antibody  . OB RESULTS CONSOLE Rubella Antibody  . OB RESULTS CONSOLE Rubella Antibody  . OB RESULTS CONSOLE Hepatitis B surface antigen  . OB RESULTS CONSOLE Hemoglobin and hematocrit, blood  . OB RESULTS CONSOLE PLATELET COUNT  . OB RESULTS CONSOLE TSH  . OB RESULTS CONSOLE ABO/Rh  . OB RESULTS CONSOLE Antibody Screen    Raelyn Mora MSN, CNM 12/03/2019

## 2019-12-08 ENCOUNTER — Encounter: Payer: Self-pay | Admitting: Obstetrics and Gynecology

## 2019-12-09 LAB — CERVICOVAGINAL ANCILLARY ONLY
Bacterial Vaginitis (gardnerella): NEGATIVE
Candida Glabrata: NEGATIVE
Candida Vaginitis: NEGATIVE
Chlamydia: NEGATIVE
Comment: NEGATIVE
Comment: NEGATIVE
Comment: NEGATIVE
Comment: NEGATIVE
Comment: NEGATIVE
Comment: NORMAL
Neisseria Gonorrhea: NEGATIVE
Trichomonas: NEGATIVE

## 2019-12-14 DIAGNOSIS — 419620001 Death: Secondary | SNOMED CT

## 2019-12-14 DEATH — deceased

## 2019-12-28 ENCOUNTER — Encounter: Payer: Medicaid Other | Admitting: Obstetrics and Gynecology

## 2019-12-31 ENCOUNTER — Encounter: Payer: Medicaid Other | Admitting: Obstetrics and Gynecology

## 2020-01-06 ENCOUNTER — Other Ambulatory Visit (HOSPITAL_COMMUNITY)
Admission: RE | Admit: 2020-01-06 | Discharge: 2020-01-06 | Disposition: A | Discharge status: Home / Self Care | Payer: Medicaid Other | Source: Ambulatory Visit

## 2020-01-06 ENCOUNTER — Ambulatory Visit (INDEPENDENT_AMBULATORY_CARE_PROVIDER_SITE_OTHER): Payer: Medicaid Other

## 2020-01-06 ENCOUNTER — Other Ambulatory Visit: Payer: Self-pay

## 2020-01-06 VITALS — BP 138/67 | HR 103 | Temp 98.0°F | Wt 173.8 lb

## 2020-01-06 DIAGNOSIS — Z3480 Encounter for supervision of other normal pregnancy, unspecified trimester: Secondary | ICD-10-CM

## 2020-01-06 DIAGNOSIS — Z124 Encounter for screening for malignant neoplasm of cervix: Secondary | ICD-10-CM | POA: Diagnosis present

## 2020-01-06 DIAGNOSIS — Z3A36 36 weeks gestation of pregnancy: Secondary | ICD-10-CM

## 2020-01-06 DIAGNOSIS — O99019 Anemia complicating pregnancy, unspecified trimester: Secondary | ICD-10-CM

## 2020-01-06 DIAGNOSIS — O99013 Anemia complicating pregnancy, third trimester: Secondary | ICD-10-CM | POA: Diagnosis not present

## 2020-01-06 NOTE — Progress Notes (Signed)
PRENATAL VISIT NOTE  Subjective:  Rhonda Blevins is a 24 y.o. G3P2001 at [redacted]w[redacted]d who presents today for routine prenatal care.  She is currently being monitored for supervision of a low-risk pregnancy with problems as listed below.  Patient reports some pelvic pressure and lower back pain.  Patient also reports vaginal discharge that is clear with a mucous consistency.  Patient is unsure if she is having contractions, but endorses fetal movement.  She states she was taking her iron pill, but stopped because her stool turned black.  Patient does endorse improvement of fatigue with iron supplementation and with increasing iron in her diet. Patient reports that she has moved to [facility] and is scheduled for her next appt at the [facility] office.    Patient Active Problem List   Diagnosis Date Noted  . 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 History of marijuana use 2019/12/07  . SID (sudden infant death) 2019-12-07  . Supervision of other normal pregnancy, antepartum 11/23/2019  . Displaced transverse fracture of shaft of left femur, initial encounter for open fracture type I or II (HCC) 10/19/2016   The following portions of the patient's history were reviewed and updated as appropriate: allergies, current medications, past family history, past medical history, past social history, past surgical history and problem list. Problem list updated.  Objective:   Vitals:   01/06/20 0908  BP: 138/67  Pulse: (!) 103  Temp: 98 F (36.7 C)  Weight: 173 lb 12.8 oz (78.8 kg)    Fetal Status: Fetal Heart Rate (bpm): 150 Fundal Height: 36 cm Movement: Present     General:  Alert, oriented and cooperative. Patient is in no acute distress.  Skin: Skin is warm and dry.   Cardiovascular: Regular rate and rhythm.  Respiratory: Normal respiratory effort. CTA-Bilaterally  Abdomen: Soft, gravid, appropriate for gestational age.  Pelvic: Cervical exam performed Dilation: Closed Effacement (%): 50 Station: Ballotable  Speculum  Exam: -External Genitalia: Clitoromegaly noted, Nontender.  -Vaginal Vault: Pink mucosa. Scant amt thin white discharge. CV collected -Cervix:Pink, no lesions, cysts, or polyps.  Appears closed. No active bleeding from os-Pap collected with brush and spatula.    Extremities: Normal range of motion.  Edema: None  Mental Status: Normal mood and affect. Normal behavior. Normal judgment and thought content.   Assessment and Plan:  Pregnancy: G3P2001 at [redacted]w[redacted]d  1. Supervision of other normal pregnancy, antepartum -Informed that some vaginal discharge is expected with pregnancy, but will test today.  -Labs as below. -Educated on GBS bacteria including what it is, why we test, and how and when we treat if needed. -Discussed releasing of results to mychart. - Culture, beta strep (group b only) - Cervicovaginal ancillary only( [place]) - Cytology - PAP( [place])  2. Pap smear for cervical cancer screening -Pap collected and sent. - Cytology - PAP( [place])   3. Anemia of mother in pregnancy, antepartum -Will collect H&H today. -Patient declines stating that she needs to go. -Will put in sticky note for completion at next visit. -Reassured that black stools are expected with proper iron supplementation. -Encouraged to restart iron as prescribed.   Term labor symptoms and general obstetric precautions including but not limited to vaginal bleeding, contractions, leaking of fluid and fetal movement were reviewed with the patient.  Please refer to After Visit Summary for other counseling recommendations.  No follow-ups on file.  Future Appointments  Date Time Provider Department Center  01/14/2020  8:15 AM Levie Heritage, DO [facility] None  Maryann Conners, CNM 01/06/2020, 3:20 PM

## 2020-01-07 LAB — CERVICOVAGINAL ANCILLARY ONLY
Bacterial Vaginitis (gardnerella): NEGATIVE
Candida Glabrata: NEGATIVE
Candida Vaginitis: NEGATIVE
Chlamydia: NEGATIVE
Comment: NEGATIVE
Comment: NEGATIVE
Comment: NEGATIVE
Comment: NEGATIVE
Comment: NEGATIVE
Comment: NORMAL
Neisseria Gonorrhea: NEGATIVE
Trichomonas: NEGATIVE

## 2020-01-08 LAB — CYTOLOGY - PAP: Diagnosis: NEGATIVE

## 2020-01-09 LAB — CULTURE, BETA STREP (GROUP B ONLY): Strep Gp B Culture: POSITIVE — AB

## 2020-01-11 ENCOUNTER — Encounter (HOSPITAL_COMMUNITY): Payer: Self-pay

## 2020-01-11 DIAGNOSIS — B951 Streptococcus, group B, as the cause of diseases classified elsewhere: Secondary | ICD-10-CM | POA: Insufficient documentation

## 2020-01-14 ENCOUNTER — Encounter: Payer: Medicaid Other | Admitting: Family Medicine

## 2020-01-21 ENCOUNTER — Encounter: Payer: Self-pay | Admitting: Obstetrics & Gynecology

## 2020-01-21 ENCOUNTER — Ambulatory Visit (INDEPENDENT_AMBULATORY_CARE_PROVIDER_SITE_OTHER): Payer: Medicaid Other | Admitting: Obstetrics & Gynecology

## 2020-01-21 ENCOUNTER — Other Ambulatory Visit: Payer: Self-pay

## 2020-01-21 VITALS — BP 136/82 | HR 93 | Wt 175.0 lb

## 2020-01-21 DIAGNOSIS — Z3A39 39 weeks gestation of pregnancy: Secondary | ICD-10-CM | POA: Diagnosis not present

## 2020-01-21 DIAGNOSIS — O99820 Streptococcus B carrier state complicating pregnancy: Secondary | ICD-10-CM

## 2020-01-21 DIAGNOSIS — Z3483 Encounter for supervision of other normal pregnancy, third trimester: Secondary | ICD-10-CM

## 2020-01-21 DIAGNOSIS — Z3480 Encounter for supervision of other normal pregnancy, unspecified trimester: Secondary | ICD-10-CM

## 2020-01-21 DIAGNOSIS — B951 Streptococcus, group B, as the cause of diseases classified elsewhere: Secondary | ICD-10-CM

## 2020-01-21 NOTE — Progress Notes (Addendum)
   PRENATAL VISIT NOTE  Subjective:  Rhonda Blevins is a 24 y.o. G3P2001 at [redacted]w[redacted]d being seen today for ongoing prenatal care.  She is currently monitored for the following issues for this high-risk pregnancy and has Displaced transverse fracture of shaft of left femur, initial encounter for open fracture type I or II (HCC); Supervision of other normal pregnancy, antepartum; History of marijuana use; SID (sudden infant death); Clitoromegaly; and Group beta Strep positive on their problem list.  Patient reports occasional contractions.  Contractions: Irregular. Vag. Bleeding: None.  Movement: Present. Denies leaking of fluid.   The following portions of the patient's history were reviewed and updated as appropriate: allergies, current medications, past family history, past medical history, past social history, past surgical history and problem list.   Objective:   Vitals:   02/10/2020 1108  BP: 136/82  Pulse: 93  Weight: 175 lb (79.4 kg)    Fetal Status: Fetal Heart Rate (bpm): 150 Fundal Height: 40 cm Movement: Present  Presentation: Vertex  General:  Alert, oriented and cooperative. Patient is in no acute distress.  Skin: Skin is warm and dry. No rash noted.   Cardiovascular: Normal heart rate noted  Respiratory: Normal respiratory effort, no problems with respiration noted  Abdomen: Soft, gravid, appropriate for gestational age.  Pain/Pressure: Present     Pelvic: Cervical exam performed Dilation: 2.5 Effacement (%): 50 Station: Ballotable  Extremities: Normal range of motion.  Edema: Trace  Mental Status: Normal mood and affect. Normal behavior. Normal judgment and thought content.   Assessment and Plan:  Pregnancy: G3P2001 at [redacted]w[redacted]d 1. Supervision of other normal pregnancy, antepartum FHR and FH wnl  2. Group beta Strep positive Reviewed need for atbx in labor. Pt had questions about retesting. She has been taking OTC remedies to cure GBS.    3. SID (sudden infant death) Reviewed  delivery by 41 weeks. Reviewed indications. Questions answered.     4. Anemia  Pt is taking iron  Term labor symptoms and general obstetric precautions including but not limited to vaginal bleeding, contractions, leaking of fluid and fetal movement were reviewed in detail with the patient. Please refer to After Visit Summary for other counseling recommendations.   Return in about 1 week (around 01/28/2020) for in person.  No future appointments.  Willodean Rosenthal, MD

## 2020-01-24 ENCOUNTER — Encounter (HOSPITAL_COMMUNITY): Payer: Self-pay | Admitting: Obstetrics and Gynecology

## 2020-01-24 ENCOUNTER — Other Ambulatory Visit: Payer: Self-pay

## 2020-01-24 ENCOUNTER — Inpatient Hospital Stay (HOSPITAL_COMMUNITY)
Admission: AD | Admit: 2020-01-24 | Discharge: 2020-01-26 | DRG: 776 | Disposition: A | Discharge status: Home / Self Care | Payer: Medicaid Other | Attending: Obstetrics and Gynecology | Admitting: Obstetrics and Gynecology

## 2020-01-24 DIAGNOSIS — Z87898 Personal history of other specified conditions: Secondary | ICD-10-CM

## 2020-01-24 DIAGNOSIS — Z3A39 39 weeks gestation of pregnancy: Secondary | ICD-10-CM

## 2020-01-24 DIAGNOSIS — F1291 Cannabis use, unspecified, in remission: Secondary | ICD-10-CM

## 2020-01-24 DIAGNOSIS — B951 Streptococcus, group B, as the cause of diseases classified elsewhere: Secondary | ICD-10-CM | POA: Diagnosis present

## 2020-01-24 MED ORDER — ZOLPIDEM TARTRATE 5 MG PO TABS: 5.0000 mg | ORAL_TABLET | Freq: Every evening | ORAL | Status: DC | PRN

## 2020-01-24 MED ORDER — ONDANSETRON HCL 4 MG PO TABS: 4.0000 mg | ORAL_TABLET | ORAL | Status: DC | PRN

## 2020-01-24 MED ORDER — PRENATAL MULTIVITAMIN CH
1.0000 | ORAL_TABLET | Freq: Every day | ORAL | Status: DC
  Filled 2020-01-24 (×2): qty 1

## 2020-01-24 MED ORDER — BENZOCAINE-MENTHOL 20-0.5 % EX AERO: 1.0000 "application " | INHALATION_SPRAY | CUTANEOUS | Status: DC | PRN

## 2020-01-24 MED ORDER — COCONUT OIL OIL: 1.0000 "application " | TOPICAL_OIL | Status: DC | PRN

## 2020-01-24 MED ORDER — SENNOSIDES-DOCUSATE SODIUM 8.6-50 MG PO TABS
2.0000 | ORAL_TABLET | ORAL | Status: DC
  Administered 2020-01-24: 2 via ORAL
  Filled 2020-01-24: qty 2

## 2020-01-24 MED ORDER — WITCH HAZEL-GLYCERIN EX PADS: 1.0000 "application " | MEDICATED_PAD | CUTANEOUS | Status: DC | PRN

## 2020-01-24 MED ORDER — IBUPROFEN 600 MG PO TABS
600.0000 mg | ORAL_TABLET | Freq: Four times a day (QID) | ORAL | Status: DC
  Administered 2020-01-24 – 2020-01-26 (×7): 600 mg via ORAL
  Filled 2020-01-24 (×9): qty 1

## 2020-01-24 MED ORDER — DIBUCAINE (PERIANAL) 1 % EX OINT: 1.0000 "application " | TOPICAL_OINTMENT | CUTANEOUS | Status: DC | PRN

## 2020-01-24 MED ORDER — SIMETHICONE 80 MG PO CHEW: 80.0000 mg | CHEWABLE_TABLET | ORAL | Status: DC | PRN

## 2020-01-24 MED ORDER — ONDANSETRON HCL 4 MG/2ML IJ SOLN: 4.0000 mg | INTRAMUSCULAR | Status: DC | PRN

## 2020-01-24 MED ORDER — DIPHENHYDRAMINE HCL 25 MG PO CAPS: 25.0000 mg | ORAL_CAPSULE | Freq: Four times a day (QID) | ORAL | Status: DC | PRN

## 2020-01-24 MED ORDER — ACETAMINOPHEN 325 MG PO TABS: 650.0000 mg | ORAL_TABLET | ORAL | Status: DC | PRN

## 2020-01-24 MED ORDER — TETANUS-DIPHTH-ACELL PERTUSSIS 5-2.5-18.5 LF-MCG/0.5 IM SUSP: 0.5000 mL | Freq: Once | INTRAMUSCULAR | Status: DC

## 2020-01-24 NOTE — MAU Note (Signed)
Patient declining the covid swab. CNM made aware.

## 2020-01-24 NOTE — MAU Note (Signed)
Pt arrived to MAU with baby in arms, with placenta delivered and attached to baby. Pt stated that she woke up this morning and felt some pressure. Pt states she felt the urge to push but thought she had to use the restroom. Pt states she got up to the restroom a few times and on the third time she felt a baby head coming out. Pt states baby did not hit toilet, father caught the baby. Pt states she believes she might have torn some, pt also stated "when I stand up it feels like everything drops".   Baby Vitals Res:32 HR:130 Temp: 97.8   Maternal vitals: SPO2 stats: 99 HR: 101 BP: 138/76 Temp: 98.8 Pain: 6/10 Res: 18

## 2020-01-24 NOTE — H&P (Signed)
Rhonda Blevins is a 24 y.o. female G3P3002 pt of [facility] presents to [facility] via personal car following unplanned vaginal delivery at home. She reports onset of painful contractions at 4 am and before she could get her s/o up to come to the hospital, she felt the urge to push. She reports the delivery was uncomplicated and baby cried and was in her arms and skin to skin immediately after delivery.  The placenta delivered within a few minutes and is still attached to the infant. There is light bleeding only.  There are no other complications.       Nursing Staff Provider  Office Location  [facility] Dating  07/06/2019 U/S  Language  English Anatomy US    Flu Vaccine  Declined 12/06/2019 Genetic Screen  NIPS:   AFP:   First Screen:  Quad:    TDaP vaccine   Declined 11/25/2019 Hgb A1C or  GTT Early 5.0 Third trimester nmL [id number]  Rhogam  N/A   LAB RESULTS   Feeding Plan Breast/Formula Blood Type A/Positive/-- (08/24 0000)   Contraception None Antibody Negative (08/24 0000)  Circumcision N/A Rubella Immune, Immune (08/24 0000)  Pediatrician  Undecided RPR Nonreactive (08/24 0000)   Support Person  HBsAg Negative (08/24 0000)   Prenatal Classes No HIV Non-reactive (08/24 0000)  BTL Consent N/A GBS  (For PCN allergy, check sensitivities)   VBAC Consent N/A Pap     Hgb Electro  Negative 10/17/2016  BP Cuff Rx sent to [facility] 11/27/2019 CF Declined  Weight Scale Rx sent to [facility] 11/18/2019 SMA Declined  Varicella Non-immune Waterbirth  [ ]  Class [ ]  Consent [ ]  CNM visit   OB History    Gravida  3   Para  2   Term  2   Preterm      AB      Living  1     SAB      TAB      Ectopic      Multiple      Live Births  2          No past medical history on file. Past Surgical History:  Procedure Laterality Date  . FEMUR IM NAIL Left 10/20/2016   Procedure: INTRAMEDULLARY (IM) NAIL FEMORAL;  Surgeon: Leandrew Koyanagi, MD;  Location: [facility];  Service: Orthopedics;  Laterality: Left;   . NO PAST SURGERIES     Family History: family history is not on file. Social History:  reports that she has never smoked. She has never used smokeless tobacco. She reports previous alcohol use. She reports current drug use. Drug: Marijuana.     Maternal Diabetes: No Genetic Screening: Declined Maternal Ultrasounds/Referrals: Normal Fetal Ultrasounds or other Referrals:  None Maternal Substance Abuse:  No Significant Maternal Medications:  None Significant Maternal Lab Results:  Group B Strep positive Other Comments:  None  Review of Systems Maternal Medical History:  Reason for admission: Precipitous delivery at home   Contractions: n/a  Fetal activity: n/a  Prenatal complications: Anemia, declined Fereheme   Prenatal Complications - Diabetes: none.      Last menstrual period 04/01/2019, unknown if currently breastfeeding. Maternal Exam:  Uterine Assessment: n/a  Abdomen: Fundal height -2, firm, bleeding moderate, wnl  Introitus: Clitoromegaly      Fetal Exam Fetal Monitor Review: n/a  Fetal State Assessment: n/a  Physical Exam  Nursing note and vitals reviewed. Constitutional: She is oriented to person, place, and time. She appears well-developed  and well-nourished.  Cardiovascular: Normal rate, regular rhythm and normal heart sounds.  Respiratory: Effort normal and breath sounds normal.  GI: Soft.  Musculoskeletal:        General: Normal range of motion.     Cervical back: Normal range of motion.  Neurological: She is alert and oriented to person, place, and time.  Skin: Skin is warm and dry.  Psychiatric: She has a normal mood and affect. Her behavior is normal. Judgment and thought content normal.    Prenatal labs: ABO, Rh: A/Positive/-- (08/24 0000) Antibody: Negative (08/24 0000) Rubella: Immune (10/21 0000) RPR: Non Reactive (01/12 0851)  HBsAg: Negative (10/21 0000)  HIV: Non Reactive (01/12 0851)  GBS: Positive/-- (02/24 1038)    MDM: Pt admitted with precipitous delivery at home prior to arrival by personal vehicle.  Pt is stable, bleeding minimal, see delivery summary for more details. Declines eye ointment, Vitamin K. Plans Lotus birth, infant is still attached to placenta at this time. Discussed with pt, potential risks of long hours/days of placenta/cord attachment and hospital stay. Pt and s/o agree to clamp and cut cord at this time.  Pt declines COVID swab on admission, discussed plan to treat patient as COVID positive with isolation, and s/o cannot leave the room and return. Pt states understanding.     Assessment/Plan: G3P2001 with unplanned precipitous vaginal delivery at home  GBS positive  PLAN Admit with recovery in MAU, then transfer to Postpartum Mom and infant stable     Sharen Counter 01/24/2020, 7:25 AM

## 2020-01-25 ENCOUNTER — Encounter (HOSPITAL_COMMUNITY): Payer: Self-pay | Admitting: Obstetrics and Gynecology

## 2020-01-25 LAB — CBC
HCT: 33.4 % — ABNORMAL LOW (ref 36.0–46.0)
Hemoglobin: 9 g/dL — ABNORMAL LOW (ref 12.0–15.0)
MCH: 17.8 pg — ABNORMAL LOW (ref 26.0–34.0)
MCHC: 26.9 g/dL — ABNORMAL LOW (ref 30.0–36.0)
MCV: 66.1 fL — ABNORMAL LOW (ref 80.0–100.0)
Platelets: 224 10*3/uL (ref 150–400)
RBC: 5.05 MIL/uL (ref 3.87–5.11)
RDW: 25.8 % — ABNORMAL HIGH (ref 11.5–15.5)
WBC: 9.4 10*3/uL (ref 4.0–10.5)
nRBC: 0 % (ref 0.0–0.2)

## 2020-01-25 LAB — RPR: RPR Ser Ql: NONREACTIVE

## 2020-01-25 NOTE — Lactation Note (Signed)
This note was copied from a baby's chart. Lactation Consultation Note LC asked mom if she would like Lactation to visit her, mom replied "No, I'm fine". Encouraged to call if needed.  Patient Name: Rhonda Blevins Today's Date: 01/25/2020     Maternal Data    Feeding Feeding Type: Bottle Fed - Formula Nipple Type: Slow - flow  LATCH Score                   Interventions    Lactation Tools Discussed/Used     Consult Status      [name] G 01/25/2020, 5:49 AM

## 2020-01-25 NOTE — Progress Notes (Signed)
Post Partum Day 1 Subjective: Patient reports feeling well. She is tolerating PO. Ambulating and urinating without difficulty. Lochia minimal.  Objective: Blood pressure 123/80, pulse 88, temperature 98.4 F (36.9 C), temperature source Oral, resp. rate 18, last menstrual period 04/01/2019, SpO2 100 %, unknown if currently breastfeeding.  Physical Exam:  General: alert, cooperative and appears stated age Lochia: appropriate Uterine Fundus: firm Incision: NA DVT Evaluation: No evidence of DVT seen on physical exam.  No results for input(s): HGB, HCT in the last 72 hours.  Assessment/Plan: Plan for discharge tomorrow. Patient would like to discharge today but baby will have to stay due to no GBS ppx. Breast and bottle feeding Declines birth control Vitals stable Refused COVID testing and therefore on precautions    LOS: 1 day   Joselyn Arrow 01/25/2020, 5:18 AM

## 2020-01-25 NOTE — Lactation Note (Signed)
This note was copied from a baby's chart. Lactation Consultation Note Asked RN if mom would like to see LC. RN stated mom is sleeping, she would ask at her next check.  Patient Name: Rhonda Blevins Today's Date: 01/25/2020     Maternal Data    Feeding Feeding Type: Breast Fed  LATCH Score                   Interventions    Lactation Tools Discussed/Used     Consult Status      Glenna Brunkow G 01/25/2020, 3:04 AM

## 2020-01-25 NOTE — Clinical Social Work Maternal (Signed)
CLINICAL SOCIAL WORK MATERNAL/CHILD NOTE  Patient Details  Name: Rhonda Blevins MRN: [id number] Date of Birth: 07/26/1996  Date:  01/25/2020  Clinical Social Worker Initiating Note:  Dhara Schepp Date/Time: Initiated:  01/25/20/0928     Child's Name:  Undecided   Biological Parents:  Mother, Father(Rhonda Blevins DOB: 05/19/1995)   Need for Interpreter:  None   Reason for Referral:  Current Substance Use/Substance Use During Pregnancy    Address:  [address] [place] [address]    Phone number:  [phone] (home)     Additional phone number:   Household Members/Support Persons (HM/SP):   Household Member/Support Person 1, Household Member/Support Person 2   HM/SP Name Relationship DOB or Age  HM/SP -1 Rhonda Blevins FOB 05/19/1995  HM/SP -2 Rhonda Blevins Daughter 05/29/2014  HM/SP -3        HM/SP -4        HM/SP -5        HM/SP -6        HM/SP -7        HM/SP -8          Natural Supports (not living in the home):  Spouse/significant other   Professional Supports: None   Employment: Unemployed   Type of Work:     Education:  Other (comment)(Has GED)   Homebound arranged:    Financial Resources:  Medicaid   Other Resources:  Food Stamps (Intends to apply for WIC, was provided with phone number)   Cultural/Religious Considerations Which May Impact Care:    Strengths:  Ability to meet basic needs , Home prepared for child    Psychotropic Medications:         Pediatrician:       Pediatrician List:   [place]    [place]    [place]    [place]    [place]    [place]      Pediatrician Fax Number:    Risk Factors/Current Problems:  Substance Use    Cognitive State:  Able to Concentrate , Alert , Linear Thinking    Mood/Affect:  Calm    CSW Assessment:  CSW received consult for THC use during pregnancy. CSW spoke with MOB via telephone to offer support and complete assessment due to  safety precautions.    CSW introduced self and explained reason for consult to which MOB expressed understanding. MOB sounded tired but answered questions appropriately and was engaged throughout assessment. MOB reported she currently lives with FOB and her 5-year-old daughter. MOB confirmed she receives food stamps and stated she intends to apply for WIC and will be provided with phone number. CSW inquired about MOB's mental health history to which MOB denied having any and denied any previous PPD/A with previous pregnancies. MOB stated she is aware of signs and symptoms of PPD/A. CSW provided brief review of education regarding the baby blues period vs. perinatal mood disorders. CSW recommended self-evaluation during the postpartum time period using the New Mom Checklist from Postpartum Progress and encouraged MOB to contact a medical professional if symptoms are noted at any time. MOB denied any current SI, HI or DV. CSW reported primary support is FOB and confirmed she feels well-supported by home. MOB confirmed having all essential items for infant once discharged and stated infant would be sleeping in a bassinet once home. CSW provided review of Sudden Infant Death Syndrome (SIDS) precautions and safe sleeping habits.    CSW inquired about MOB's   substance use history to which MOB acknowledged using marijuana at the very beginning of her pregnancy, prior to finding out she was pregnant. Per MOB, she discontinued use once she found out. CSW informed MOB of Hospital Drug Policy and explained UDS and CDS were still pending and that a CPS report would be made, if warranted. MOB did not initially have questions regarding policy but did inquire later about how long it would take for CDS results to be back. CSW explained CDS results usually take about 5 days to result. CSW asked if there were any concerns or questions about this to which MOB denied. CSW inquired about previous CPS involvement to which MOB shared  CPS was involved in 2018 with her 2nd child. CSW aware MOB's second child passed from SIDS at 75 months old in 2018. MOB reported case has been closed and that there were no concerns.   CSW to continue to monitor drug screens and make CPS report, if warranted.    CSW Plan/Description:  No Further Intervention Required/No Barriers to Discharge, Sudden Infant Death Syndrome (SIDS) Education, Perinatal Mood and Anxiety Disorder (PMADs) Education, Hospital Drug Screen Policy Information, CSW Will Continue to Monitor Umbilical Cord Tissue Drug Screen Results and Make Report if [facility], LCSW 2020/10/27, 10:07 AM

## 2020-01-26 MED ORDER — ACETAMINOPHEN 325 MG PO TABS: 650.0000 mg | ORAL_TABLET | ORAL | 0 refills | Status: AC | PRN

## 2020-01-26 MED ORDER — IBUPROFEN 600 MG PO TABS: 600.0000 mg | ORAL_TABLET | Freq: Four times a day (QID) | ORAL | 0 refills | Status: AC

## 2020-01-26 NOTE — Lactation Note (Signed)
This note was copied from a baby's chart. Lactation Consultation Note  Patient Name: Rhonda Blevins Today's Date: 01/26/2020   Per Jamesetta Orleans, RN, mom does not want to see lactation.    Lurline Hare [facility] 01/26/2020, 10:47 AM

## 2020-01-26 NOTE — Lactation Note (Signed)
This note was copied from a baby's chart. Lactation Consultation Note  Patient Name: Rhonda Blevins Today's Date: 01/26/2020   Jamesetta Orleans, RN will let me know if Mom wants to see Lactation prior to d/c.   Lurline Hare [facility] 01/26/2020, 7:44 AM

## 2020-01-26 NOTE — Discharge Summary (Signed)
Postpartum Discharge Summary     Patient Name: Rhonda Blevins DOB: 1996/03/12 MRN: [id number]  Date of admission: 01/24/2020 Delivering Provider:    Date of discharge: 01/26/2020  Admitting diagnosis: Precipitous delivery [O62.3] Intrauterine pregnancy: [redacted]w[redacted]d    Secondary diagnosis:  Active Problems:   History of marijuana use   Group beta Strep positive   Precipitous delivery  Additional problems: None     Discharge diagnosis: Term Pregnancy Delivered                                                                                                Post partum procedures:None  Augmentation: NA  Complications: None  Hospital course:  Patient presented to MAU after precipitous delivery at home. Cord clamping delayed by 30 minutes to 1 hour and pt initially planning a Lotus birth but after further discussion, agreed to clamp and cut cord.  There is light bleeding and mild abdominal cramping. The placenta, per the patient, delivered within a few minutes and umbilical cord was attached to infant upon arrival, with placenta in a bag. Routine post-partum course. Declined contraception.  Delivery time: 4:35 AM    Magnesium Sulfate received: No BMZ received: No Rhophylac:No MMR:No Transfusion:No  Physical exam  Vitals:   01/24/20 2059 01/25/20 0606 01/25/20 1440 01/25/20 1955  BP: 123/80 124/77 119/77 131/89  Pulse: 88 72 85 95  Resp: _0 Temp: 98.4 F (36.9 C) 97.7 F (36.5 C) 98.6 F (37 C) 98.2 F (36.8 C)  TempSrc: Oral Oral Oral Oral  SpO2:   100% 100%   General: alert, cooperative and no distress Lochia: appropriate Uterine Fundus: firm Incision: N/A DVT Evaluation: No evidence of DVT seen on physical exam. Labs: Lab Results  Component Value Date   WBC 9.4 01/25/2020   HGB 9.0 (L) 01/25/2020   HCT 33.4 (L) 01/25/2020   MCV 66.1 (L) 01/25/2020   PLT 224 01/25/2020   CMP Latest Ref Rng & Units 05/01/2017  Glucose 65 - 99 mg/dL 73  BUN 6 - 20 mg/dL 6   Creatinine 0.44 - 1.00 mg/dL 0.68  Sodium 135 - 145 mmol/L 137  Potassium 3.5 - 5.1 mmol/L 3.8  Chloride 101 - 111 mmol/L 107  CO2 22 - 32 mmol/L 22  Calcium 8.9 - 10.3 mg/dL 8.9  Total Protein 6.5 - 8.1 g/dL 7.1  Total Bilirubin 0.3 - 1.2 mg/dL 1.0  Alkaline Phos 38 - 126 U/L 131(H)  AST 15 - 41 U/L 19  ALT 14 - 54 U/L 13(L)   Edinburgh Score: Edinburgh Postnatal Depression Scale Screening Tool 01/25/2020  I have been able to laugh and see the funny side of things. 0  I have looked forward with enjoyment to things. 0  I have blamed myself unnecessarily when things went wrong. 2  I have been anxious or worried for no good reason. 0  I have felt scared or panicky for no good reason. 0  Things have been getting on top of me. 0  I have been so unhappy that I have had difficulty sleeping. 0  I  have felt sad or miserable. 0  I have been so unhappy that I have been crying. 0  The thought of harming myself has occurred to me. 0  Edinburgh Postnatal Depression Scale Total 2    Discharge instruction: per After Visit Summary and "Baby and Me Booklet".  After visit meds:  Allergies as of 01/26/2020   No Known Allergies     Medication List    TAKE these medications   acetaminophen 325 MG tablet Commonly known as: Tylenol Take 2 tablets (650 mg total) by mouth every 4 (four) hours as needed (for pain scale < 4).   Blood Pressure Monitor Automat Devi 1 Device by Does not apply route daily. Automatic blood pressure cuff regular size. To monitor blood pressure regularly at home. ICD-10 code: O09.90   ferrous sulfate 325 (65 FE) MG tablet Commonly known as: FerrouSul Take 1 tablet (325 mg total) by mouth 2 (two) times daily with a meal.   Gojji Weight Scale Misc 1 Device by Does not apply route daily as needed. To weight self daily as needed at home. ICD-10 code: O09.90   ibuprofen 600 MG tablet Commonly known as: ADVIL Take 1 tablet (600 mg total) by mouth every 6 (six) hours.    multivitamin-prenatal 27-0.8 MG Tabs tablet Take 1 tablet by mouth daily at 12 noon.       Diet: routine diet  Activity: Advance as tolerated. Pelvic rest for 6 weeks.   Outpatient follow up:4 weeks Follow up Appt: Future Appointments  Date Time Provider [place]  03/03/2020  3:45 PM Truett Mainland, DO [facility] None   Follow up Visit:    Please schedule this patient for Postpartum visit in: 4 weeks with the following provider: Any provider Virtual For C/S patients schedule nurse incision check in weeks 2 weeks: no Low risk pregnancy complicated by: none Delivery mode:  SVD Anticipated Birth Control:  other/unsure PP Procedures needed: None  Schedule Integrated BH visit: no     Newborn Data: Live born female  Birth Weight: 7 lb 1.4 oz (3215 g) APGAR: uknown  Newborn Delivery   Birth date/time: 01/24/2020 04:35:00 Delivery type: Vaginal, Spontaneous      Baby Feeding: Breast Disposition:home with mother   01/26/2020 Chauncey Mann, MD

## 2020-02-11 DIAGNOSIS — 419620001 Death: Secondary | SNOMED CT

## 2020-02-11 DEATH — deceased

## 2020-02-29 ENCOUNTER — Other Ambulatory Visit: Payer: Self-pay

## 2020-03-03 ENCOUNTER — Ambulatory Visit: Payer: Medicaid Other | Admitting: Family Medicine
